# Patient Record
Sex: Female | Born: 1965 | Race: White | Hispanic: No | Marital: Married | Smoking: Current every day smoker
Health system: Southern US, Community
[De-identification: ages and names within clinical notes are randomized; demographics above are authoritative.]

## PROBLEM LIST (undated history)

## (undated) DIAGNOSIS — I728 Aneurysm of other specified arteries: Secondary | ICD-10-CM

## (undated) DIAGNOSIS — E079 Disorder of thyroid, unspecified: Secondary | ICD-10-CM

## (undated) DIAGNOSIS — Z972 Presence of dental prosthetic device (complete) (partial): Secondary | ICD-10-CM

## (undated) HISTORY — PX: THYROIDECTOMY: SHX17

## (undated) HISTORY — PX: CHOLECYSTECTOMY: SHX55

## (undated) HISTORY — PX: NECK SURGERY: SHX720

## (undated) HISTORY — PX: APPENDECTOMY: SHX54

## (undated) HISTORY — PX: ABDOMINAL HYSTERECTOMY: SHX81

## (undated) HISTORY — PX: TUBAL LIGATION: SHX77

## (undated) HISTORY — PX: SPINE SURGERY: SHX786

## (undated) HISTORY — DX: Disorder of thyroid, unspecified: E07.9

---

## 2018-04-15 ENCOUNTER — Emergency Department: Payer: BLUE CROSS/BLUE SHIELD

## 2018-04-15 ENCOUNTER — Other Ambulatory Visit: Payer: Self-pay

## 2018-04-15 DIAGNOSIS — R0789 Other chest pain: Secondary | ICD-10-CM | POA: Diagnosis present

## 2018-04-15 DIAGNOSIS — F17210 Nicotine dependence, cigarettes, uncomplicated: Secondary | ICD-10-CM | POA: Diagnosis not present

## 2018-04-15 DIAGNOSIS — I728 Aneurysm of other specified arteries: Secondary | ICD-10-CM | POA: Insufficient documentation

## 2018-04-15 DIAGNOSIS — R002 Palpitations: Secondary | ICD-10-CM | POA: Diagnosis not present

## 2018-04-15 DIAGNOSIS — R079 Chest pain, unspecified: Secondary | ICD-10-CM | POA: Diagnosis not present

## 2018-04-15 LAB — CBC
HCT: 45.1 % (ref 36.0–46.0)
Hemoglobin: 14.8 g/dL (ref 12.0–15.0)
MCH: 30.6 pg (ref 26.0–34.0)
MCHC: 32.8 g/dL (ref 30.0–36.0)
MCV: 93.2 fL (ref 80.0–100.0)
Platelets: 264 10*3/uL (ref 150–400)
RBC: 4.84 MIL/uL (ref 3.87–5.11)
RDW: 12.5 % (ref 11.5–15.5)
WBC: 14.5 10*3/uL — ABNORMAL HIGH (ref 4.0–10.5)
nRBC: 0 % (ref 0.0–0.2)

## 2018-04-15 LAB — BASIC METABOLIC PANEL
Anion gap: 8 (ref 5–15)
BUN: 10 mg/dL (ref 6–20)
CO2: 24 mmol/L (ref 22–32)
Calcium: 8.8 mg/dL — ABNORMAL LOW (ref 8.9–10.3)
Chloride: 106 mmol/L (ref 98–111)
Creatinine, Ser: 0.69 mg/dL (ref 0.44–1.00)
GFR calc Af Amer: >60 mL/min (ref >60)
GFR calc non Af Amer: >60 mL/min (ref >60)
Glucose, Bld: 101 mg/dL — ABNORMAL HIGH (ref 70–99)
Potassium: 3.7 mmol/L (ref 3.5–5.1)
SODIUM: 138 mmol/L (ref 135–145)

## 2018-04-15 LAB — TROPONIN I
Troponin I: <0.03 ng/mL (ref <0.03)
Troponin I: <0.03 ng/mL (ref <0.03)

## 2018-04-15 NOTE — ED Triage Notes (Signed)
States central CP that goes to back that began this AM. States hx of "heart palpitations but they have gotten more frequent recently"   A&O, ambulatory, no distress noted. Speaking in complete sentences.

## 2018-04-16 ENCOUNTER — Emergency Department: Payer: BLUE CROSS/BLUE SHIELD

## 2018-04-16 ENCOUNTER — Emergency Department
Admission: EM | Admit: 2018-04-16 | Discharge: 2018-04-16 | Disposition: A | Discharge status: Home / Self Care | Payer: BLUE CROSS/BLUE SHIELD | Attending: Emergency Medicine | Admitting: Emergency Medicine

## 2018-04-16 DIAGNOSIS — R079 Chest pain, unspecified: Secondary | ICD-10-CM

## 2018-04-16 DIAGNOSIS — I728 Aneurysm of other specified arteries: Secondary | ICD-10-CM

## 2018-04-16 DIAGNOSIS — R002 Palpitations: Secondary | ICD-10-CM

## 2018-04-16 MED ORDER — AZITHROMYCIN 500 MG PO TABS: 500.0000 mg | ORAL_TABLET | Freq: Every day | ORAL | 0 refills | Status: AC

## 2018-04-16 MED ORDER — IOHEXOL 350 MG/ML SOLN
75.0000 mL | Freq: Once | INTRAVENOUS | Status: AC | PRN
  Administered 2018-04-16: 75 mL via INTRAVENOUS

## 2018-04-16 NOTE — ED Provider Notes (Signed)
[facility] Emergency Department Provider Note _______   First MD Initiated Contact with Patient 04/16/18 [phone]     (approximate)  I have reviewed the triage vital signs and the nursing notes.   HISTORY  Chief Complaint Chest Pain    HPI Crystal Carson is a 52 y.o. female presents to the emergency department with central chest pain radiating to the back which the patient states began this morning.  Patient also admits to intermittent irregular heartbeat which patient states has been happening for "a very long time however has become more frequent lately.  Patient denies any lower extremity pain or swelling.  Patient denies any dyspnea.  Patient denies any history of CAD PE or DVT.  Patient does admit to 1 pack/day cigarette habit.   Past medical history No chronic medical conditions. There are no active problems to display for this patient.   Past Surgical History:  Procedure Laterality Date  . ABDOMINAL HYSTERECTOMY    . APPENDECTOMY    . CESAREAN SECTION    . CHOLECYSTECTOMY    . NECK SURGERY    . THYROIDECTOMY      Prior to Admission medications   Medication Sig Start Date End Date Taking? Authorizing Provider  azithromycin (ZITHROMAX) 500 MG tablet Take 1 tablet (500 mg total) by mouth daily for 3 days. Take 1 tablet daily for 3 days. 04/16/18 04/19/18  Gregor Hams, MD    Allergies No known drug allergies History reviewed. No pertinent family history.  Social History Social History   Tobacco Use  . Smoking status: Current Every Day Smoker  Substance Use Topics  . Alcohol use: Yes  . Drug use: Not on file    Review of Systems Constitutional: No fever/chills Eyes: No visual changes. ENT: No sore throat. Cardiovascular: Positive for chest pain. Respiratory: Denies shortness of breath. Gastrointestinal: No abdominal pain.  No nausea, no vomiting.  No diarrhea.  No constipation. Genitourinary: Negative for  dysuria. Musculoskeletal: Negative for neck pain.  Negative for back pain. Integumentary: Negative for rash. Neurological: Negative for headaches, focal weakness or numbness.   ____________________________________________   PHYSICAL EXAM:  VITAL SIGNS: ED Triage Vitals  Enc Vitals Group     BP 04/15/18 1830 132/89     Pulse Rate 04/15/18 1830 96     Resp 04/15/18 1830 18     Temp 04/15/18 1830 98.5 F (36.9 C)     Temp Source 04/15/18 2141 Oral     SpO2 04/15/18 1830 99 %     Weight 04/15/18 1755 86.2 kg (190 lb)     Height 04/15/18 1755 1.651 m (5\' 5" )     Head Circumference --      Peak Flow --      Pain Score 04/15/18 1755 4     Pain Loc --      Pain Edu? --      Excl. in [place]? --     Constitutional: Alert and oriented. Well appearing and in no acute distress. Eyes: Conjunctivae are normal.  Mouth/Throat: Mucous membranes are moist.  Oropharynx non-erythematous. Neck: No stridor.  Cardiovascular: Normal rate, regular rhythm. Good peripheral circulation. Grossly normal heart sounds. Respiratory: Normal respiratory effort.  No retractions. Lungs CTAB. Gastrointestinal: Soft and nontender. No distention.  Musculoskeletal: No lower extremity tenderness nor edema. No gross deformities of extremities. Neurologic:  Normal speech and language. No gross focal neurologic deficits are appreciated.  Skin:  Skin is warm, dry and intact. No rash noted.  Psychiatric: Mood and affect are normal. Speech and behavior are normal.  ____________________________________________   LABS (all labs ordered are listed, but only abnormal results are displayed)  Labs Reviewed  BASIC METABOLIC PANEL - Abnormal; Notable for the following components:      Result Value   Glucose, Bld 101 (*)    Calcium 8.8 (*)    All other components within normal limits  CBC - Abnormal; Notable for the following components:   WBC 14.5 (*)    All other components within normal limits  TROPONIN I  TROPONIN I    ____________________________________________  EKG  ED ECG REPORT I, Beckwourth N , the attending physician, personally viewed and interpreted this ECG.   Date: 04/16/2018  EKG Time: 5:57 PM  Rate: 74  Rhythm: Normal sinus rhythm  Axis: Normal  Intervals: Normal  ST&T Change: None  ____________________________________________  RADIOLOGY I, Robeline N , personally viewed and evaluated these images (plain radiographs) as part of my medical decision making, as well as reviewing the written report by the radiologist.  ED MD interpretation: No active cardiopulmonary disease noted on chest x-ray. CT angiogram of the chest revealed no evidence of pulmonary emboli per radiologist.  Incidental finding of a 3.1 cm splenic artery aneurysm noted  Official radiology report(s): Dg Chest 2 View  Result Date: 04/15/2018 CLINICAL DATA:  Chest pain. EXAM: CHEST - 2 VIEW COMPARISON:  None. FINDINGS: The heart size and mediastinal contours are within normal limits. Both lungs are clear. No pneumothorax or pleural effusion is noted. The visualized skeletal structures are unremarkable. IMPRESSION: No active cardiopulmonary disease. Electronically Signed   By: Marijo Conception, M.D.   On: 04/15/2018 18:25   Ct Angio Chest Pe W And/or Wo Contrast  Result Date: 04/16/2018 CLINICAL DATA:  52 year old female with chest pain. EXAM: CT ANGIOGRAPHY CHEST WITH CONTRAST TECHNIQUE: Multidetector CT imaging of the chest was performed using the standard protocol during bolus administration of intravenous contrast. Multiplanar CT image reconstructions and MIPs were obtained to evaluate the vascular anatomy. CONTRAST:  23mL OMNIPAQUE IOHEXOL 350 MG/ML SOLN COMPARISON:  Chest radiograph dated 04/15/2018 FINDINGS: Cardiovascular: There is no cardiomegaly or pericardial effusion. The thoracic aorta is unremarkable. The origins of the great vessels of the aortic arch appear patent as visualized. There is no CT  evidence of pulmonary embolism. Mediastinum/Nodes: Top-normal bilateral hilar lymph nodes measuring up to 13 mm in the right hilum of indeterminate clinical significance, possibly reactive. Clinical correlation is recommended. Esophagus is grossly unremarkable. Status post prior right hemithyroidectomy. No mediastinal fluid collection. Lungs/Pleura: Several small scattered calcified granuloma. No focal consolidation, pleural effusion, or pneumothorax. The central airways are patent. Upper Abdomen: Cholecystectomy. 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 Musculoskeletal: Mild degenerative changes of the spine. Partially visualized lower cervical ACDF. Review of the MIP images confirms the above findings. IMPRESSION: 1. No acute intrathoracic pathology. No CT evidence of pulmonary embolism. 2. **An incidental finding of potential clinical significance has been found. A 3.1 Cm rim calcified splenic artery aneurysm. Interventional radiology or vascular surgery consult is advised.** Electronically Signed   By: Anner Crete M.D.   On: 04/16/2018 02:16    Procedures   ____________________________________________   INITIAL IMPRESSION / ASSESSMENT AND PLAN / ED COURSE  As part of my medical decision making, I reviewed the following data within the electronic MEDICAL RECORD NUMBER   52 year old female presenting with above-stated history and physical exam secondary to chest pain.  Patient has no  chest pain at present.  Considered possibly of CAD and a such EKG was performed which revealed no evidence of ischemia or infarction troponin negative x2.  Also considered possibility of a pulmonary emboli or aortic aneurysm and as such CT angiogram of the chest was performed which revealed no evidence of PE or aneurysm.  Incidental finding of a splenic aneurysm was noted.  Patient discussed with Dr. dew vascular surgeon regarding the patient's splenic aneurysm who will see the patient on the outpatient  setting.  Patient will be referred to cardiology Dr. Rockey Situ as well for further outpatient evaluation.  Spoke with the patient and her husband at length regarding necessity of outpatient follow-up and also instructed them of warning signs that would warrant immediate return to the emergency department. ____________________________________________  FINAL CLINICAL IMPRESSION(S) / ED DIAGNOSES  Final diagnoses:  Splenic artery aneurysm (HCC)  Nonspecific chest pain  Heart palpitations     MEDICATIONS GIVEN DURING THIS VISIT:  Medications  iohexol (OMNIPAQUE) 350 MG/ML injection 75 mL (75 mLs Intravenous Contrast Given 04/16/18 0147)     ED Discharge Orders         Ordered    azithromycin (ZITHROMAX) 500 MG tablet  Daily     04/16/18 0332           Note:  This document was prepared using Dragon voice recognition software and may include unintentional dictation errors.    Gregor Hams, MD 04/16/18 0730

## 2018-04-19 ENCOUNTER — Telehealth (INDEPENDENT_AMBULATORY_CARE_PROVIDER_SITE_OTHER): Payer: Self-pay

## 2018-04-19 NOTE — Telephone Encounter (Signed)
I attempted to contact the patient by leaving a voicemail, unfortunately her voicemail hangs up before you can leave your name and number for a return call. I attempted to contact her spouse and had the same result of the voicemail hanging up before I could leave a message.

## 2018-04-20 ENCOUNTER — Ambulatory Visit (INDEPENDENT_AMBULATORY_CARE_PROVIDER_SITE_OTHER): Payer: BLUE CROSS/BLUE SHIELD | Admitting: Vascular Surgery

## 2018-04-20 ENCOUNTER — Encounter (INDEPENDENT_AMBULATORY_CARE_PROVIDER_SITE_OTHER): Payer: Self-pay | Admitting: Vascular Surgery

## 2018-04-20 VITALS — BP 123/81 | HR 88 | Resp 18 | Ht 65.0 in | Wt 196.4 lb

## 2018-04-20 DIAGNOSIS — F17200 Nicotine dependence, unspecified, uncomplicated: Secondary | ICD-10-CM

## 2018-04-20 DIAGNOSIS — I728 Aneurysm of other specified arteries: Secondary | ICD-10-CM

## 2018-04-20 DIAGNOSIS — Z720 Tobacco use: Secondary | ICD-10-CM | POA: Insufficient documentation

## 2018-04-20 NOTE — Assessment & Plan Note (Signed)
Tobacco use was a risk factor for splenic artery aneurysm or other aneurysm formation.  Tobacco cessation is recommended.

## 2018-04-20 NOTE — Assessment & Plan Note (Signed)
I have independently reviewed the patient's CT scan and she has an approximately 3 cm splenic artery aneurysm near the hilum of the spleen.  We had a long discussion today.  This is a reasonably large aneurysm and does pose a risk of rupture.  This is a serious situation and should be repaired.  We discussed several options for repair including open splenectomy, covered stent placement, and coil embolization.  The location of her aneurysmal likely preclude covered stent placement, but coil embolization would be an excellent option and much less invasive than surgery.  I have discussed the risks and benefits of the procedure in detail.  The patient and her daughter voiced their understanding and are agreeable to proceed but would like to do this after the first of the year.

## 2018-04-20 NOTE — Progress Notes (Signed)
Patient ID: Crystal Carson, female   DOB: [birth year], 52 y.o.   MRN: [id number]  Chief Complaint  Patient presents with  . New Patient (Initial Visit)    HPI Crystal Carson is a 53 y.o. female.  I am asked to see the patient by Dr. Owens Carson for evaluation of splenic artery aneurysm.  The patient reports no obvious symptoms related to the aneurysm.  She was seen in the ER last week with chest pain and as part of her work-up a CT scan of the chest was performed which I have independently reviewed.  Her chest pain has resolved.  On the lower cuts of the CT scan of the chest, she has a 3 cm splenic artery aneurysm.  This is near the hilum of the spleen and well out a tortuous splenic artery.  There is flow in the aneurysm sac.  There is no evidence of extravasation or rupture.  No other obvious aneurysms are seen, but this did not include the majority of the abdomen or the pelvis on the CT scan.  She has a long history of tobacco use which continues.   Past Medical History Chest pain Tobacco use Splenic artery aneurysm  Past Surgical History:  Procedure Laterality Date  . ABDOMINAL HYSTERECTOMY    . APPENDECTOMY    . CESAREAN SECTION    . CHOLECYSTECTOMY    . NECK SURGERY    . THYROIDECTOMY      Family History No bleeding disorders, clotting disorders, aneurysms, or autoimmune diseases  Social History Social History   Tobacco Use  . Smoking status: Current Every Day Smoker  . Smokeless tobacco: Never Used  Substance Use Topics  . Alcohol use: Yes  . Drug use: Not on file    No Known Allergies  No current outpatient medications on file.   No current facility-administered medications for this visit.       REVIEW OF SYSTEMS (Negative unless checked)  Constitutional: [] Weight loss  [] Fever  [] Chills Cardiac: [x] Chest pain   [] Chest pressure   [x] Palpitations   [] Shortness of breath when laying flat   [] Shortness of breath at rest   [x] Shortness of breath with  exertion. Vascular:  [] Pain in legs with walking   [] Pain in legs at rest   [] Pain in legs when laying flat   [] Claudication   [] Pain in feet when walking  [] Pain in feet at rest  [] Pain in feet when laying flat   [] History of DVT   [] Phlebitis   [] Swelling in legs   [] Varicose veins   [] Non-healing ulcers Pulmonary:   [] Uses home oxygen   [] Productive cough   [] Hemoptysis   [] Wheeze  [] COPD   [] Asthma Neurologic:  [] Dizziness  [] Blackouts   [] Seizures   [] History of stroke   [] History of TIA  [] Aphasia   [] Temporary blindness   [] Dysphagia   [] Weakness or numbness in arms   [] Weakness or numbness in legs Musculoskeletal:  [] Arthritis   [] Joint swelling   [] Joint pain   [] Low back pain Hematologic:  [] Easy bruising  [] Easy bleeding   [] Hypercoagulable state   [] Anemic  [] Hepatitis Gastrointestinal:  [] Blood in stool   [] Vomiting blood  [] Gastroesophageal reflux/heartburn   [] Abdominal pain Genitourinary:  [] Chronic kidney disease   [] Difficult urination  [] Frequent urination  [] Burning with urination   [] Hematuria Skin:  [] Rashes   [] Ulcers   [] Wounds Psychological:  [] History of anxiety   []  History of major depression.    Physical Exam BP 123/81 (BP Location:  Right Arm, Patient Position: Sitting)   Pulse 88   Resp 18   Ht 5\' 5"  (1.651 m)   Wt 196 lb 6.4 oz (89.1 kg)   BMI 32.68 kg/m  Gen:  WD/WN, NAD Head: [place]/AT, No temporalis wasting.  Ear/Nose/Throat: Hearing grossly intact, nares w/o erythema or drainage, oropharynx w/o Erythema/Exudate Eyes: Conjunctiva clear, sclera non-icteric  Neck: trachea midline.   Pulmonary:  Good air movement, aspirations not labored, no use of accessory muscles Cardiac: RRR, no JVD Vascular:  Vessel Right Left  Radial Palpable Palpable                          PT Palpable Palpable  DP Palpable Palpable   Gastrointestinal: soft, non-tender/non-distended. No guarding/reflex. Musculoskeletal: M/S 5/5 throughout.  Extremities without ischemic  changes.  No deformity or atrophy.  No edema. Neurologic: Sensation grossly intact in extremities.  Symmetrical.  Speech is fluent. Motor exam as listed above. Psychiatric: Judgment intact, Mood & affect appropriate for pt's clinical situation. Dermatologic: No rashes or ulcers noted.  No cellulitis or open wounds.   Radiology Dg Chest 2 View  Result Date: 04/15/2018 CLINICAL DATA:  Chest pain. EXAM: CHEST - 2 VIEW COMPARISON:  None. FINDINGS: The heart size and mediastinal contours are within normal limits. Both lungs are clear. No pneumothorax or pleural effusion is noted. The visualized skeletal structures are unremarkable. IMPRESSION: No active cardiopulmonary disease. Electronically Signed   By: Marijo Conception, M.D.   On: 04/15/2018 18:25   Ct Angio Chest Pe W And/or Wo Contrast  Result Date: 04/16/2018 CLINICAL DATA:  52 year old female with chest pain. EXAM: CT ANGIOGRAPHY CHEST WITH CONTRAST TECHNIQUE: Multidetector CT imaging of the chest was performed using the standard protocol during bolus administration of intravenous contrast. Multiplanar CT image reconstructions and MIPs were obtained to evaluate the vascular anatomy. CONTRAST:  41mL OMNIPAQUE IOHEXOL 350 MG/ML SOLN COMPARISON:  Chest radiograph dated 04/15/2018 FINDINGS: Cardiovascular: There is no cardiomegaly or pericardial effusion. The thoracic aorta is unremarkable. The origins of the great vessels of the aortic arch appear patent as visualized. There is no CT evidence of pulmonary embolism. Mediastinum/Nodes: Top-normal bilateral hilar lymph nodes measuring up to 13 mm in the right hilum of indeterminate clinical significance, possibly reactive. Clinical correlation is recommended. Esophagus is grossly unremarkable. Status post prior right hemithyroidectomy. No mediastinal fluid collection. Lungs/Pleura: Several small scattered calcified granuloma. No focal consolidation, pleural effusion, or pneumothorax. The central airways  are patent. Upper Abdomen: Cholecystectomy. 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 Musculoskeletal: Mild degenerative changes of the spine. Partially visualized lower cervical ACDF. Review of the MIP images confirms the above findings. IMPRESSION: 1. No acute intrathoracic pathology. No CT evidence of pulmonary embolism. 2. **An incidental finding of potential clinical significance has been found. A 3.1 Cm rim calcified splenic artery aneurysm. Interventional radiology or vascular surgery consult is advised.** Electronically Signed   By: Anner Crete M.D.   On: 04/16/2018 02:16    Labs Recent Results (from the past 2160 hour(s))  Basic metabolic panel     Status: Abnormal   Collection Time: 04/15/18  5:58 PM  Result Value Ref Range   Sodium 138 135 - 145 mmol/L   Potassium 3.7 3.5 - 5.1 mmol/L   Chloride 106 98 - 111 mmol/L   CO2 24 22 - 32 mmol/L   Glucose, Bld 101 (H) 70 - 99 mg/dL   BUN 10 6 - 20  mg/dL   Creatinine, Ser 0.69 0.44 - 1.00 mg/dL   Calcium 8.8 (L) 8.9 - 10.3 mg/dL   GFR calc non Af Amer >60 >60 mL/min   GFR calc Af Amer >60 >60 mL/min   Anion gap 8 5 - 15    Comment: Performed at [facility], [address] [place], [place] [address]  CBC     Status: Abnormal   Collection Time: 04/15/18  5:58 PM  Result Value Ref Range   WBC 14.5 (H) 4.0 - 10.5 K/uL   RBC 4.84 3.87 - 5.11 MIL/uL   Hemoglobin 14.8 12.0 - 15.0 g/dL   HCT 45.1 36.0 - 46.0 %   MCV 93.2 80.0 - 100.0 fL   MCH 30.6 26.0 - 34.0 pg   MCHC 32.8 30.0 - 36.0 g/dL   RDW 12.5 11.5 - 15.5 %   Platelets 264 150 - 400 K/uL   nRBC 0.0 0.0 - 0.2 %    Comment: Performed at [facility], [address] [place], [place] [address]  Troponin I - ONCE - STAT     Status: None   Collection Time: 04/15/18  5:58 PM  Result Value Ref Range   Troponin I <0.03 <0.03 ng/mL    Comment: Performed at [facility], [address] [place], [place] [address]  Troponin I - ONCE -  STAT     Status: None   Collection Time: 04/15/18  9:55 PM  Result Value Ref Range   Troponin I <0.03 <0.03 ng/mL    Comment: Performed at [facility], [address] [place],  [address]    Assessment/Plan:  Tobacco use disorder Tobacco use was a risk factor for splenic artery aneurysm or other aneurysm formation.  Tobacco cessation is recommended.  Splenic artery aneurysm ([place]) I have independently reviewed the patient's CT scan and she has an approximately 3 cm splenic artery aneurysm near the hilum of the spleen.  We had a long discussion today.  This is a reasonably large aneurysm and does pose a risk of rupture.  This is a serious situation and should be repaired.  We discussed several options for repair including open splenectomy, covered stent placement, and coil embolization.  The location of her aneurysmal likely preclude covered stent placement, but coil embolization would be an excellent option and much less invasive than surgery.  I have discussed the risks and benefits of the procedure in detail.  The patient and her daughter voiced their understanding and are agreeable to proceed but would like to do this after the first of the year.      Leotis Pain 04/20/2018, 12:02 PM   This note was created with Dragon medical transcription system.  Any errors from dictation are unintentional.

## 2018-04-20 NOTE — Patient Instructions (Signed)
Retroperitoneal Bleeding  Retroperitoneal bleeding happens when the blood vessels behind your abdomen bleed into the space between your abdomen and your back (retroperitoneal space). This space contains:   Your kidneys.   The glands that are on top of your kidneys (adrenal glands).   The tubes that drain urine from your kidneys (ureters).   The large blood vessel that carries blood to your lower body (aorta).   Some parts of your digestive tract.    Bleeding may come from any organ in the retroperitoneal space. This is a rare but life-threatening condition.  What are the causes?  This condition may be caused by:   Surgery in the retroperitoneal space.   Injury (trauma) to your pelvic area, abdomen, or back.    When retroperitoneal bleeding is not caused by trauma, it is called spontaneous retroperitoneal bleeding. Spontaneous retroperitoneal bleeding may be caused by:   Tumors of the kidneys or adrenal glands.   Use of medicines that thin your blood (anticoagulants).   Diseases that cause your body to be unable to form blood clots (clotting disorders).   Swelling in a blood vessel from a weakened artery wall (aneurysm) that can rupture and cause bleeding.    Sometimes, the cause is not known (idiopathic).  What increases the risk?  This condition is more likely to develop in people:   Who are on dialysis.   Who take anticoagulant medicine.   Who have high blood pressure.   Who have hardening of the arteries (atherosclerosis).    What are the signs or symptoms?  Signs and symptoms may appear suddenly if the bleeding starts suddenly (acute), or they may appear gradually if the bleeding occurs slowly over time (chronic). Symptoms of this condition include:   Pain in the abdomen or side (flank).   Pain when pressing on the abdomen or flank.   Dizziness or light-headedness from low blood pressure.   Rapid heartbeat (tachycardia).   Blood in the urine.   Very low blood pressure, which can cause  shock. Symptoms of shock include:  ? Fainting.  ? Trouble with breathing.  ? Cold, clammy skin.    How is this diagnosed?  This condition may be diagnosed based on your symptoms and your medical history. It is important to find the cause of the bleeding. Your health care provider will do a physical exam. You may also have tests, including:   Abdominal X-rays to check for signs of bleeding or aneurysm.   Blood tests to check for low blood counts (anemia) or blood loss.   Imaging studies, such as:  ? CT scan. You may have dye injected into a blood vessel to help locate the source of the bleeding (CT angiography).  ? Ultrasound.  ? MRI.    How is this treated?  The goal of treatment is to remove the blood and stop the bleeding. Treatment may include:   Giving you fluids through an IV tube to get your blood pressure back into a normal range.   Giving you blood from a donor (transfusion).   Placing a long tube (catheter) through your blood vessel to the site of bleeding and blocking the bleeding with a small plug (embolization).   Exploratory surgery to find the bleeding and stop it. This may be done using an operating scope (laparoscopy) or as an open surgery (laparotomy).    This information is not intended to replace advice given to you by your health care provider. Make sure you discuss any 

## 2018-05-03 ENCOUNTER — Encounter (INDEPENDENT_AMBULATORY_CARE_PROVIDER_SITE_OTHER): Payer: Self-pay

## 2018-05-08 NOTE — Progress Notes (Signed)
Cardiology Office Note  Date:  05/10/2018   ID:  Crystal Carson, DOB [birth year], MRN [id number]  PCP:  Patient, No Pcp Per   Chief Complaint  Patient presents with  . other    Follow up from [facility] ER; chest pain. Pt. c/o palpitations, chest pain and shortness of breath.    HPI:  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 Who presents by referral from the emergency room for evaluation of her chest pain symptoms  Reports that she went to the emergency room April 16, 2018 Hospital records reviewed with the patient in detail Long hx of palpitations On December 13 she felt a "heaviness in the chest" worse that day Symptoms went all day, little better in the Er Seem to resolve on their own Denies having any pressure in her chest in the past No pressure since that time No regular exercise program but able to do all of her ADLs and shopping without any chest discomfort -Emergency room work-up reviewed with her, I EKG benign, cardiac enzymes negative  She does not have any inhalers Denies having any significant GERD  Chest CT  04/2018 images old up and reviewed with her 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 dew CT scan with no coronary calcification, no significant aortic atherosclerosis  She is having some left side abdominal discomfort Etiology unclear, she wonders if it could be from the splenic aneurysm  EKG personally reviewed by myself on todays visit Shows normal sinus rhythm with rate 78 bpm no significant ST or T wave changes   PMH:   has no past medical history on file.  PSH:    Past Surgical History:  Procedure Laterality Date  . ABDOMINAL HYSTERECTOMY    . APPENDECTOMY    . CESAREAN SECTION    . CHOLECYSTECTOMY    . NECK SURGERY    . THYROIDECTOMY      No current  outpatient medications on file.   No current facility-administered medications for this visit.     Allergies:   Patient has no known allergies.   Social History:  The patient  reports that she has been smoking cigarettes. She has a 40.00 pack-year smoking history. She has never used smokeless tobacco. She reports current alcohol use. She reports that she does not use drugs.   Family History:   family history includes Heart attack in her paternal grandfather; Hypertension in her mother; Stroke (age of onset: 67) in her mother.    Review of Systems: Review of Systems  Constitutional: Negative.   Respiratory: Negative.   Cardiovascular: Positive for chest pain.  Gastrointestinal: Positive for abdominal pain.  Musculoskeletal: Negative.   Neurological: Negative.   Psychiatric/Behavioral: Negative.   All other systems reviewed and are negative.    PHYSICAL EXAM: VS:  BP 120/80 (BP Location: Right Arm, Patient Position: Sitting, Cuff Size: Normal)   Pulse 78   Ht 5\' 5"  (1.651 m)   Wt 198 lb 4 oz (89.9 kg)   BMI 32.99 kg/m  , BMI Body mass index is 32.99 kg/m. GEN: Well nourished, well developed, in no acute distress  HEENT: normal  Neck: no JVD, carotid bruits, or masses Cardiac: RRR; no murmurs, rubs, or gallops,no edema  Respiratory:  clear to auscultation bilaterally, normal work of breathing GI: soft, nontender, nondistended, + BS  MS: no deformity or atrophy  Skin: warm and dry, no rash Neuro:  Strength and sensation are intact Psych: euthymic mood, full affect    Recent Labs: 04/15/2018: BUN 10; Creatinine, Ser 0.69; Hemoglobin 14.8; Platelets 264; Potassium 3.7; Sodium 138    Lipid Panel No results found for: CHOL, HDL, LDLCALC, TRIG    Wt Readings from Last 3 Encounters:  05/10/18 198 lb 4 oz (89.9 kg)  04/20/18 196 lb 6.4 oz (89.1 kg)  04/15/18 190 lb (86.2 kg)       ASSESSMENT AND PLAN:  Splenic artery aneurysm (HCC) - Plan: EKG 12-Lead Scheduled  for coiling later this week CT scan images reviewed with her in detail  Tobacco use disorder Coupon provided for Chantix She will think about it, and if she would like to try it she will talk with primary care for a prescription  Chest pain, unspecified type - Plan: EKG 12-Lead Atypical in nature, no further episodes since the emergency room CT scan reviewed with her with no coronary calcification and no aortic atherosclerosis Relatively good exercise tolerance Recommend if she has any further episodes especially with exertion that she call our office for further evaluation, stress test could be performed  Disposition:   F/U as needed   Total encounter time more than 45 minutes  Greater than 50% was spent in counseling and coordination of care with the patient    Orders Placed This Encounter  Procedures  . EKG 12-Lead     Signed, Esmond Plants, M.D., Ph.D. 05/10/2018  [place], [place]

## 2018-05-10 ENCOUNTER — Ambulatory Visit (INDEPENDENT_AMBULATORY_CARE_PROVIDER_SITE_OTHER): Payer: BLUE CROSS/BLUE SHIELD | Admitting: Cardiovascular Disease

## 2018-05-10 ENCOUNTER — Encounter: Payer: Self-pay | Admitting: Cardiovascular Disease

## 2018-05-10 VITALS — BP 120/80 | HR 78 | Ht 65.0 in | Wt 198.2 lb

## 2018-05-10 DIAGNOSIS — F17200 Nicotine dependence, unspecified, uncomplicated: Secondary | ICD-10-CM | POA: Diagnosis not present

## 2018-05-10 DIAGNOSIS — I728 Aneurysm of other specified arteries: Secondary | ICD-10-CM | POA: Diagnosis not present

## 2018-05-10 DIAGNOSIS — R079 Chest pain, unspecified: Secondary | ICD-10-CM | POA: Diagnosis not present

## 2018-05-10 NOTE — Patient Instructions (Signed)

## 2018-05-12 ENCOUNTER — Other Ambulatory Visit (INDEPENDENT_AMBULATORY_CARE_PROVIDER_SITE_OTHER): Payer: Self-pay | Admitting: Nurse Practitioner

## 2018-05-12 MED ORDER — DEXTROSE 5 % IV SOLN
2.0000 g | Freq: Once | INTRAVENOUS | Status: DC
  Filled 2018-05-12: qty 20

## 2018-05-13 ENCOUNTER — Encounter
Admission: RE | Disposition: A | Discharge status: Home / Self Care | Payer: Self-pay | Source: Home / Self Care | Attending: Vascular Surgery

## 2018-05-13 ENCOUNTER — Other Ambulatory Visit: Payer: Self-pay

## 2018-05-13 ENCOUNTER — Ambulatory Visit
Admission: RE | Admit: 2018-05-13 | Discharge: 2018-05-13 | Disposition: A | Discharge status: Home / Self Care | Payer: BLUE CROSS/BLUE SHIELD | Attending: Vascular Surgery | Admitting: Vascular Surgery

## 2018-05-13 DIAGNOSIS — I728 Aneurysm of other specified arteries: Secondary | ICD-10-CM

## 2018-05-13 DIAGNOSIS — Z90710 Acquired absence of both cervix and uterus: Secondary | ICD-10-CM | POA: Diagnosis not present

## 2018-05-13 DIAGNOSIS — F17200 Nicotine dependence, unspecified, uncomplicated: Secondary | ICD-10-CM | POA: Diagnosis not present

## 2018-05-13 HISTORY — PX: VISCERAL ANGIOGRAPHY: CATH118276

## 2018-05-13 LAB — CREATININE, SERUM
Creatinine, Ser: 0.57 mg/dL (ref 0.44–1.00)
GFR calc Af Amer: >60 mL/min (ref >60)
GFR calc non Af Amer: >60 mL/min (ref >60)

## 2018-05-13 LAB — BUN: BUN: 13 mg/dL (ref 6–20)

## 2018-05-13 SURGERY — VISCERAL ANGIOGRAPHY
Anesthesia: Moderate Sedation

## 2018-05-13 MED ORDER — FENTANYL CITRATE (PF) 100 MCG/2ML IJ SOLN
INTRAMUSCULAR | Status: AC
  Filled 2018-05-13: qty 2

## 2018-05-13 MED ORDER — MIDAZOLAM HCL 2 MG/2ML IJ SOLN
INTRAMUSCULAR | Status: AC
  Filled 2018-05-13: qty 2

## 2018-05-13 MED ORDER — FENTANYL CITRATE (PF) 100 MCG/2ML IJ SOLN
INTRAMUSCULAR | Status: DC | PRN
  Administered 2018-05-13 (×3): 50 ug via INTRAVENOUS
  Administered 2018-05-13 (×2): 25 ug via INTRAVENOUS

## 2018-05-13 MED ORDER — CEFAZOLIN SODIUM-DEXTROSE 2-4 GM/100ML-% IV SOLN
INTRAVENOUS | Status: AC
  Administered 2018-05-13: 09:00:00
  Filled 2018-05-13: qty 100

## 2018-05-13 MED ORDER — HYDROCODONE-ACETAMINOPHEN 5-325 MG PO TABS
ORAL_TABLET | ORAL | Status: AC
  Filled 2018-05-13: qty 1

## 2018-05-13 MED ORDER — SODIUM CHLORIDE 0.9 % IV SOLN
INTRAVENOUS | Status: DC
  Administered 2018-05-13: 08:00:00 via INTRAVENOUS

## 2018-05-13 MED ORDER — IOPAMIDOL (ISOVUE-300) INJECTION 61%
INTRAVENOUS | Status: DC | PRN
  Administered 2018-05-13: 50 mL via INTRA_ARTERIAL

## 2018-05-13 MED ORDER — HYDROMORPHONE HCL 1 MG/ML IJ SOLN: 1.0000 mg | Freq: Once | INTRAMUSCULAR | Status: DC | PRN

## 2018-05-13 MED ORDER — HYDROCODONE-ACETAMINOPHEN 5-325 MG PO TABS: 1.0000 | ORAL_TABLET | Freq: Four times a day (QID) | ORAL | 0 refills | Status: DC | PRN

## 2018-05-13 MED ORDER — HEPARIN SODIUM (PORCINE) 1000 UNIT/ML IJ SOLN
INTRAMUSCULAR | Status: AC
  Filled 2018-05-13: qty 1

## 2018-05-13 MED ORDER — HEPARIN SODIUM (PORCINE) 1000 UNIT/ML IJ SOLN
INTRAMUSCULAR | Status: DC | PRN
  Administered 2018-05-13: 3000 [IU] via INTRAVENOUS

## 2018-05-13 MED ORDER — MIDAZOLAM HCL 5 MG/5ML IJ SOLN
INTRAMUSCULAR | Status: AC
  Filled 2018-05-13: qty 5

## 2018-05-13 MED ORDER — ONDANSETRON HCL 4 MG/2ML IJ SOLN: 4.0000 mg | Freq: Four times a day (QID) | INTRAMUSCULAR | Status: DC | PRN

## 2018-05-13 MED ORDER — LIDOCAINE-EPINEPHRINE (PF) 1 %-1:200000 IJ SOLN
INTRAMUSCULAR | Status: AC
  Filled 2018-05-13: qty 10

## 2018-05-13 MED ORDER — MIDAZOLAM HCL 2 MG/2ML IJ SOLN
INTRAMUSCULAR | Status: DC | PRN
  Administered 2018-05-13: 1 mg via INTRAVENOUS
  Administered 2018-05-13: 2 mg via INTRAVENOUS
  Administered 2018-05-13 (×3): 1 mg via INTRAVENOUS

## 2018-05-13 MED ORDER — HYDROCODONE-ACETAMINOPHEN 5-325 MG PO TABS
1.0000 | ORAL_TABLET | Freq: Once | ORAL | Status: AC
  Administered 2018-05-13: 1 via ORAL

## 2018-05-13 MED ORDER — HEPARIN (PORCINE) IN NACL 1000-0.9 UT/500ML-% IV SOLN
INTRAVENOUS | Status: AC
  Filled 2018-05-13: qty 1000

## 2018-05-13 SURGICAL SUPPLY — 29 items
CATH BEACON 5 .035 65 KMP TIP (CATHETERS) ×1 IMPLANT
CATH MICROCATH PRGRT 2.8F 110 (CATHETERS) IMPLANT
CATH PIG 70CM (CATHETERS) ×1 IMPLANT
CATH VS15FR (CATHETERS) ×1 IMPLANT
COIL 400 COMPLEX SOFT 10X35CM (Vascular Products) ×1 IMPLANT
COIL 400 COMPLEX SOFT 20X60CM (Vascular Products) ×1 IMPLANT
COIL 400 COMPLEX STD 10X35CM (Vascular Products) ×2 IMPLANT
COIL 400 COMPLEX STD 24X57CM (Vascular Products) ×1 IMPLANT
COIL 400 COMPLEX STD 8X25CM (Vascular Products) ×1 IMPLANT
COIL 400 COMPLEX STD 9X30CM (Vascular Products) ×1 IMPLANT
DEVICE OCCLUSION POD8 (Vascular Products) IMPLANT
DEVICE OCCLUSION PODJ45 (Vascular Products) IMPLANT
DEVICE OCCLUSION PODJ60 (Vascular Products) IMPLANT
DEVICE STARCLOSE SE CLOSURE (Vascular Products) ×1 IMPLANT
DEVICE TORQUE .025-.038 (MISCELLANEOUS) ×1 IMPLANT
GLIDECATH 4FR STR (CATHETERS) ×1 IMPLANT
GLIDEWIRE STIFF .35X180X3 HYDR (WIRE) ×1 IMPLANT
HANDLE DETACHMENT COIL (MISCELLANEOUS) ×1 IMPLANT
MICROCATH PROGREAT 2.8F 110 CM (CATHETERS) ×2
OCCLUSION DEVICE POD8 (Vascular Products) ×2 IMPLANT
OCCLUSION DEVICE PODJ45 (Vascular Products) ×2 IMPLANT
OCCLUSION DEVICE PODJ60 (Vascular Products) ×2 IMPLANT
PACK ANGIOGRAPHY (CUSTOM PROCEDURE TRAY) ×2 IMPLANT
SHEATH ANL2 6FRX45 HC (SHEATH) ×1 IMPLANT
SHEATH BRITE TIP 5FRX11 (SHEATH) ×1 IMPLANT
SYR MEDRAD MARK V 150ML (SYRINGE) ×1 IMPLANT
TUBING CONTRAST HIGH PRESS 72 (TUBING) ×2 IMPLANT
WIRE J 3MM .035X145CM (WIRE) ×1 IMPLANT
WIRE MAGIC TOR.035 180C (WIRE) ×1 IMPLANT

## 2018-05-13 NOTE — Discharge Instructions (Signed)
Moderate Conscious Sedation, Adult, Care After °These instructions provide you with information about caring for yourself after your procedure. Your health care provider may also give you more specific instructions. Your treatment has been planned according to current medical practices, but problems sometimes occur. Call your health care provider if you have any problems or questions after your procedure. °What can I expect after the procedure? °After your procedure, it is common: °· To feel sleepy for several hours. °· To feel clumsy and have poor balance for several hours. °· To have poor judgment for several hours. °· To vomit if you eat too soon. °Follow these instructions at home: °For at least 24 hours after the procedure: ° °· Do not: °? Participate in activities where you could fall or become injured. °? Drive. °? Use heavy machinery. °? Drink alcohol. °? Take sleeping pills or medicines that cause drowsiness. °? Make important decisions or sign legal documents. °? Take care of children on your own. °· Rest. °Eating and drinking °· Follow the diet recommended by your health care provider. °· If you vomit: °? Drink water, juice, or soup when you can drink without vomiting. °? Make sure you have little or no nausea before eating solid foods. °General instructions °· Have a responsible adult stay with you until you are awake and alert. °· Take over-the-counter and prescription medicines only as told by your health care provider. °· If you smoke, do not smoke without supervision. °· Keep all follow-up visits as told by your health care provider. This is important. °Contact a health care provider if: °· You keep feeling nauseous or you keep vomiting. °· You feel light-headed. °· You develop a rash. °· You have a fever. °Get help right away if: °· You have trouble breathing. °This information is not intended to replace advice given to you by your health care provider. Make sure you discuss any questions you have  with your health care provider. °Document Released: 02/09/2013 Document Revised: 09/24/2015 Document Reviewed: 08/11/2015 °Elsevier Interactive Patient Education © 2019 [employer] °Femoral Site Care °This sheet gives you information about how to care for yourself after your procedure. Your health care provider may also give you more specific instructions. If you have problems or questions, contact your health care provider. °What can I expect after the procedure? °After the procedure, it is common to have: °· Bruising that usually fades within 1-2 weeks. °· Tenderness at the site. °Follow these instructions at home: °Wound care °· Follow instructions from your health care provider about how to take care of your insertion site. Make sure you: °? Wash your hands with soap and water before you change your bandage (dressing). If soap and water are not available, use hand sanitizer. °? Change your dressing as told by your health care provider. °? Leave stitches (sutures), skin glue, or adhesive strips in place. These skin closures may need to stay in place for 2 weeks or longer. If adhesive strip edges start to loosen and curl up, you may trim the loose edges. Do not remove adhesive strips completely unless your health care provider tells you to do that. °· Do not take baths, swim, or use a hot tub until your health care provider approves. °· You may shower 24-48 hours after the procedure or as told by your health care provider. °? Gently wash the site with plain soap and water. °? Pat the area dry with a clean towel. °? Do not rub the site. This may cause   bleeding. °· Do not apply powder or lotion to the site. Keep the site clean and dry. °· Check your femoral site every day for signs of infection. Check for: °? Redness, swelling, or pain. °? Fluid or blood. °? Warmth. °? Pus or a bad smell. °Activity °· For the first 2-3 days after your procedure, or as long as directed: °? Avoid climbing stairs as much as  possible. °? Do not squat. °· Do not lift anything that is heavier than 10 lb (4.5 kg), or the limit that you are told, until your health care provider says that it is safe. °· Rest as directed. °? Avoid sitting for a long time without moving. Get up to take short walks every 1-2 hours. °· Do not drive for 24 hours if you were given a medicine to help you relax (sedative). °General instructions °· Take over-the-counter and prescription medicines only as told by your health care provider. °· Keep all follow-up visits as told by your health care provider. This is important. °Contact a health care provider if you have: °· A fever or chills. °· You have redness, swelling, or pain around your insertion site. °Get help right away if: °· The catheter insertion area swells very fast. °· You pass out. °· You suddenly start to sweat or your skin gets clammy. °· The catheter insertion area is bleeding, and the bleeding does not stop when you hold steady pressure on the area. °· The area near or just beyond the catheter insertion site becomes pale, cool, tingly, or numb. °These symptoms may represent a serious problem that is an emergency. Do not wait to see if the symptoms will go away. Get medical help right away. Call your local emergency services (911 in the [place] Do not drive yourself to the hospital. °Summary °· After the procedure, it is common to have bruising that usually fades within 1-2 weeks. °· Check your femoral site every day for signs of infection. °· Do not lift anything that is heavier than 10 lb (4.5 kg), or the limit that you are told, until your health care provider says that it is safe. °This information is not intended to replace advice given to you by your health care provider. Make sure you discuss any questions you have with your health care provider. °Document Released: 12/23/2013 Document Revised: 05/04/2017 Document Reviewed: 05/04/2017 °Elsevier Interactive Patient Education © 2019 Elsevier  Inc. ° °

## 2018-05-13 NOTE — Progress Notes (Signed)
Patient post angiogram per Dr Dew,tolerated well. Vitals stable at this time. Awake/alert and oriented. Denies complaints. No bleeding nor hematoma at right groin site. Sinus rhythm per monitor. Taking po's without difficulty.

## 2018-05-13 NOTE — H&P (Signed)
[facility] History & Physical Update  The patient was interviewed and re-examined.  The patient's previous History and Physical has been reviewed and is unchanged.  There is no change in the plan of care. We plan to proceed with the scheduled procedure.  Leotis Pain, MD  05/13/2018, 8:40 AM

## 2018-05-13 NOTE — Progress Notes (Signed)
Taking po's without difficulty,given norco 1 po for 4/10 lower abdominal discomfort,vitals stable no bleeding nor hematoma at right groin site. Discharge instructions given with questions answered.

## 2018-05-13 NOTE — Op Note (Signed)
[facility] Percutaneous Study/Intervention Procedural Note   Date of Surgery: 05/13/2018  Surgeon(s): Leotis Pain  Assistants:none  Pre-operative Diagnosis: Splenic artery aneurysm  Post-operative diagnosis: Same  Procedure(s) Performed: 1. Ultrasound guidance for vascular access right femoral artery 2. Catheter placement into terminal branches of the splenic artery and the splenic hilum from right femoral approach 3. Aortogram and selective celiac artery and splenic artery angiograms 4. Coil embolization of the splenic artery both proximal and distal to the aneurysm as well as the aneurysm using a series of Ruby coils 5. StarClose closure device right femoral artery  EBL: 10 cc  Contrast: 50 cc  Fluoro Time: 11.9 min  Moderate Conscious Sedation time: approximately 45 minutes using 6 mg of Versed and 200 Mcg of Fentanyl  Indications: Patient is a 53 y.o. female with a greater than 3 cm splenic artery aneurysm. The patient has CT scan study showing the splenic artery aneurysm out near the hilum of the spleen. The patient is brought in for angiography for further evaluation and potential treatment. Risks and benefits are discussed and informed consent is obtained  Procedure: The patient was identified and appropriate procedural time out was performed. The patient was then placed supine on the table and prepped and draped in the usual sterile fashion. Moderate conscious sedation was administered throughout the procedure with a face to face encounter with the patient throughout and with my supervision of the RN administering medicines and monitoring the patients vital signs and mental status throughout from the start of the procedure until the patient was taken to the recovery room.  Ultrasound was used to evaluate the right common femoral artery. It was patent . A digital  ultrasound image was acquired. A Seldinger needle was used to access the right common femoral artery under direct ultrasound guidance and a permanent image was performed. A 0.035 J wire was advanced without resistance and a 5Fr sheath was placed. Pigtail catheter was placed into the aorta and an AP aortogram was performed. This demonstrated normal renal arteries and normal aorta and iliac segments without significant stenosis. I then gave a small dose of heparin and used a V S1 catheter to selectively cannulate the celiac artery.  This was done without difficulty and a steep oblique projection.  Selective imaging of the celiac artery was then performed.There was a typical trifurcation of the celiac artery with a splenic artery originally traversing to the right and then coursing back to the left and being markedly tortuous.  There was a very large splenic artery aneurysm in the mid to distal main splenic artery.  I then used a Glidewire and the V S1 catheter to track well out the main splenic artery.  I then exchanged for a Magic torque wire and placed a 6 [place] Ansell sheath from the right femoral location up into the celiac artery and the first 4 to 5 cm of the splenic artery.  The splenic artery was incredibly tortuous and using a pro-grate microcatheter and a 65 cm Kumpe catheter I was able to navigate into the aneurysm and across the aneurysm parking the pro-grate catheter in the terminal branches of the splenic artery into the hilum.  Imaging confirmed intraluminal location and then we proceeded with treatment.  I began by packing Ruby coils starting at the termination of the main splenic artery and into the hilar splenic artery branches.  The first coil was an 8 x 25 standard and then a pod 8 was used to help  fill that distal splenic artery beyond the aneurysm.  A series of large coils were then used within the aneurysm itself including a 20 x 60 soft and a 24 x 60 standard and a 45 and a 60 cm packing  coil.  A 10 mm x 35 cm standard coil was also placed.  This resulted in excellent filling of the aneurysm itself.  I then began the coil embolization of the splenic artery proximal to the aneurysm.  Another 10 mm diameter by 35 cm standard coil was then placed and then the same size soft coil was placed.  The most proximally placed coil was a 9 mm x 30 standard which packed back at least 5 cm proximal to the splenic artery aneurysm just before a small branch and before the pancreaticoduodenal branches.  At this point, I was very pleased with the result and selective imaging through the sheath showed excellent coil embolization of the splenic artery aneurysm itself as well as the proximal and distal arteries.  I elected to terminate the procedure. The sheath was removed and StarClose closure device was deployed in the right femoral artery with excellent hemostatic result. The patient was taken to the recovery room in stable condition having tolerated the procedure well.  Findings:  Aortogram:Aorta and iliac arteries without significant stenosis.  Renal arteries were widely patent.  The SMA and celiac artery appeared to have brisk flow although their origins were not well seen on the AP aortogram  Celiac/splenic artery: There was a typical trifurcation of the celiac artery with a splenic artery originally traversing to the right and then coursing back to the left and being markedly tortuous.  There was a very large splenic artery aneurysm in the mid to distal main splenic artery.    Disposition: Patient was taken to the recovery room in stable condition having tolerated the procedure well.  Complications: None  Leotis Pain 05/13/2018 10:12 AM     This note was created with Dragon Medical transcription system. Any errors in dictation are purely unintentional.

## 2018-05-18 ENCOUNTER — Ambulatory Visit (INDEPENDENT_AMBULATORY_CARE_PROVIDER_SITE_OTHER): Payer: BLUE CROSS/BLUE SHIELD | Admitting: Family Medicine

## 2018-05-18 ENCOUNTER — Encounter: Payer: Self-pay | Admitting: Family Medicine

## 2018-05-18 VITALS — BP 138/84 | HR 97 | Temp 98.2°F | Ht 65.0 in | Wt 149.9 lb

## 2018-05-18 DIAGNOSIS — Z1239 Encounter for other screening for malignant neoplasm of breast: Secondary | ICD-10-CM

## 2018-05-18 DIAGNOSIS — M25511 Pain in right shoulder: Secondary | ICD-10-CM

## 2018-05-18 DIAGNOSIS — Z9889 Other specified postprocedural states: Secondary | ICD-10-CM | POA: Diagnosis not present

## 2018-05-18 DIAGNOSIS — Z1211 Encounter for screening for malignant neoplasm of colon: Secondary | ICD-10-CM

## 2018-05-18 DIAGNOSIS — K635 Polyp of colon: Secondary | ICD-10-CM | POA: Insufficient documentation

## 2018-05-18 DIAGNOSIS — T8484XA Pain due to internal orthopedic prosthetic devices, implants and grafts, initial encounter: Secondary | ICD-10-CM

## 2018-05-18 DIAGNOSIS — M79644 Pain in right finger(s): Secondary | ICD-10-CM

## 2018-05-18 DIAGNOSIS — G8929 Other chronic pain: Secondary | ICD-10-CM

## 2018-05-18 DIAGNOSIS — M47812 Spondylosis without myelopathy or radiculopathy, cervical region: Secondary | ICD-10-CM | POA: Insufficient documentation

## 2018-05-18 DIAGNOSIS — Z720 Tobacco use: Secondary | ICD-10-CM | POA: Diagnosis not present

## 2018-05-18 DIAGNOSIS — R202 Paresthesia of skin: Secondary | ICD-10-CM | POA: Diagnosis not present

## 2018-05-18 DIAGNOSIS — Z9089 Acquired absence of other organs: Secondary | ICD-10-CM

## 2018-05-18 DIAGNOSIS — E890 Postprocedural hypothyroidism: Secondary | ICD-10-CM

## 2018-05-18 DIAGNOSIS — D72829 Elevated white blood cell count, unspecified: Secondary | ICD-10-CM

## 2018-05-18 DIAGNOSIS — M4722 Other spondylosis with radiculopathy, cervical region: Secondary | ICD-10-CM

## 2018-05-18 DIAGNOSIS — Z13220 Encounter for screening for lipoid disorders: Secondary | ICD-10-CM

## 2018-05-18 DIAGNOSIS — D122 Benign neoplasm of ascending colon: Secondary | ICD-10-CM

## 2018-05-18 LAB — UA/M W/RFLX CULTURE, ROUTINE
Bilirubin, UA: NEGATIVE
GLUCOSE, UA: NEGATIVE
Ketones, UA: NEGATIVE
Leukocytes, UA: NEGATIVE
Nitrite, UA: NEGATIVE
Protein, UA: NEGATIVE
RBC UA: NEGATIVE
Urobilinogen, Ur: 0.2 mg/dL (ref 0.2–1.0)
pH, UA: 6 (ref 5.0–7.5)

## 2018-05-18 LAB — BAYER DCA HB A1C WAIVED: HB A1C: 5.1 % (ref <7.0)

## 2018-05-18 NOTE — Patient Instructions (Signed)
Please call Sylvia @ [phone] to schedule your mammogram.   

## 2018-05-18 NOTE — Progress Notes (Signed)
BP 138/84   Pulse 97   Temp 98.2 F (36.8 C) (Oral)   Ht 5\' 5"  (1.651 m)   Wt 149 lb 14.4 oz (68 kg)   SpO2 98%   BMI 24.94 kg/m    Subjective:    Patient ID: [name], female    DOB: 1965-10-20, 53 y.o.   MRN: [id number]  HPI: Crystal Carson is a 53 y.o. female who presents today to establish care. She last saw doctor in [place], [place] about 3 years ago. Had been seeing a doctor in [place] for quite a while, hasn't seen one regularly for a while. Saw the Dr. In [place] 1x- enough to get a few tests ordered, but then did not go back  Chief Complaint  Patient presents with  . New Patient (Initial Visit)  . Abdominal Pain    Thinks colon polyps are back  . Carpal Tunnel    Right hand. x 1 year  . Shoulder Pain    pt believes its related to Capal Tunnel. Loss of range of motion.   . Over Active Bladder   Has a history of thyroidectomy. Is not on thyroid replacement. Has gained 40 lbs in the last 2 years.   Has been having some pain in her belly- feels like when she had polyps in the past. Had a colonoscopy 3 years ago and was advised that she needed a repeat in 3 years. Does not know what type of polyps she had. Has been having aching, sore pain on the R side. She states that this is exactly the same pain she had when she had the polyps in the past and would like to get back into GI. She states that there was some concern that if she continued with polyps that she may need to have a partial colectomy.  R thumb pain- she feels like this is carpal tunnel. She notes that she does not have any numbness or tingling. She has been having discomfort and decreased range of motion in her thumb and it used to pop, but it doesn't pop any more. Now just does move. She is very frustrated by this.   SHOULDER PAIN- started with a tightness in her forearm that seemed to go up into her arm and shoulder, now in the shoulder with pain and loss of motion. Hurt her neck years ago. Had degenerative disc in her  neck and had surgery on that 2005/6 Duration: couple of months Involved shoulder: right Mechanism of injury: unknown Location: diffuse Onset:gradual Severity: 6/10  Quality:  Aching and sharp with movement Frequency: constant Radiation: yes into the arm Aggravating factors: movement  Alleviating factors: nothing  Status: worse Treatments attempted: ibuprofen  Relief with NSAIDs?:  mild Weakness: yes Numbness: no Decreased grip strength: yes Redness: no Swelling: no Bruising: no Fevers: no   Depression screen PHQ 2/9 05/19/2018  Decreased Interest 2  Down, Depressed, Hopeless 0  PHQ - 2 Score 2  Altered sleeping 3  Tired, decreased energy 3  Change in appetite 2  Feeling bad or failure about yourself  0  Trouble concentrating 0  Moving slowly or fidgety/restless 0  Suicidal thoughts 0  PHQ-9 Score 10  Difficult doing work/chores Somewhat difficult   GAD 7 : Generalized Anxiety Score 05/19/2018  Nervous, Anxious, on Edge 0  Control/stop worrying 0  Worry too much - different things 0  Trouble relaxing 0  Restless 0  Easily annoyed or irritable 0  Afraid - awful might happen 0  Total GAD 7 Score 0  Anxiety Difficulty Not difficult at all    Active Ambulatory Problems    Diagnosis Date Noted  . Tobacco abuse 04/20/2018  . Splenic artery aneurysm ([place]) 04/20/2018  . S/P thyroidectomy 05/18/2018  . DJD (degenerative joint disease) of cervical spine 05/18/2018  . Polyp of ascending colon 05/18/2018  . Painful orthopaedic hardware ([place]) 05/23/2018   Resolved Ambulatory Problems    Diagnosis Date Noted  . Chest pain 05/10/2018   Past Medical History:  Diagnosis Date  . Thyroid disease    Past Surgical History:  Procedure Laterality Date  . ABDOMINAL HYSTERECTOMY    . APPENDECTOMY    . CESAREAN SECTION    . CHOLECYSTECTOMY    . NECK SURGERY    . SPINE SURGERY    . THYROIDECTOMY    . TUBAL LIGATION    . VISCERAL ANGIOGRAPHY N/A 05/13/2018   Procedure:  VISCERAL ANGIOGRAPHY;  Surgeon: Algernon Huxley, MD;  Location: [facility];  Service: Cardiovascular;  Laterality: N/A;   Outpatient Encounter Medications as of 05/18/2018  Medication Sig  . HYDROcodone-acetaminophen (NORCO) 5-325 MG tablet Take 1 tablet by mouth every 6 (six) hours as needed for moderate pain.   No facility-administered encounter medications on file as of 05/18/2018.    No Known Allergies  Social History   Socioeconomic History  . Marital status: Married    Spouse name: Not on file  . Number of children: Not on file  . Years of education: Not on file  . Highest education level: Not on file  Occupational History  . Not on file  Social Needs  . Financial resource strain: Not on file  . Food insecurity:    Worry: Not on file    Inability: Not on file  . Transportation needs:    Medical: Not on file    Non-medical: Not on file  Tobacco Use  . Smoking status: Current Every Day Smoker    Packs/day: 1.00    Years: 40.00    Pack years: 40.00    Types: Cigarettes  . Smokeless tobacco: Never Used  Substance and Sexual Activity  . Alcohol use: Yes  . Drug use: Never  . Sexual activity: Not on file  Lifestyle  . Physical activity:    Days per week: Not on file    Minutes per session: Not on file  . Stress: Not on file  Relationships  . Social connections:    Talks on phone: Not on file    Gets together: Not on file    Attends religious service: Not on file    Active member of club or organization: Not on file    Attends meetings of clubs or organizations: Not on file    Relationship status: Not on file  Other Topics Concern  . Not on file  Social History Narrative  . Not on file   Family History  Problem Relation Age of Onset  . Hypertension Mother   . Stroke Mother 71  . Heart attack Paternal Grandfather   . Dementia Maternal Aunt   . Dementia Maternal Grandmother     Review of Systems  Constitutional: Negative.   Respiratory: Negative.    Cardiovascular: Negative.   Gastrointestinal: Positive for abdominal pain. Negative for abdominal distention, anal bleeding, blood in stool, constipation, diarrhea, nausea, rectal pain and vomiting.  Endocrine: Negative.   Musculoskeletal: Positive for arthralgias, myalgias, neck pain and neck stiffness. Negative for back pain, gait problem and  joint swelling.  Skin: Negative.   Neurological: Negative.   Psychiatric/Behavioral: Negative.     Per HPI unless specifically indicated above     Objective:    BP 138/84   Pulse 97   Temp 98.2 F (36.8 C) (Oral)   Ht 5\' 5"  (1.651 m)   Wt 149 lb 14.4 oz (68 kg)   SpO2 98%   BMI 24.94 kg/m   Wt Readings from Last 3 Encounters:  05/18/18 149 lb 14.4 oz (68 kg)  05/13/18 198 lb 6.6 oz (90 kg)  05/10/18 198 lb 4 oz (89.9 kg)    Physical Exam Vitals signs and nursing note reviewed.  Constitutional:      General: She is not in acute distress.    Appearance: Normal appearance. She is not ill-appearing, toxic-appearing or diaphoretic.  HENT:     Head: Normocephalic and atraumatic.     Right Ear: External ear normal.     Left Ear: External ear normal.     Nose: Nose normal.     Mouth/Throat:     Mouth: Mucous membranes are moist.     Pharynx: Oropharynx is clear.  Eyes:     General: No scleral icterus.       Right eye: No discharge.        Left eye: No discharge.     Extraocular Movements: Extraocular movements intact.     Conjunctiva/sclera: Conjunctivae normal.     Pupils: Pupils are equal, round, and reactive to light.  Neck:     Musculoskeletal: Normal range of motion and neck supple.  Cardiovascular:     Rate and Rhythm: Normal rate and regular rhythm.     Pulses: Normal pulses.     Heart sounds: Normal heart sounds. No murmur. No friction rub. No gallop.   Pulmonary:     Effort: Pulmonary effort is normal. No respiratory distress.     Breath sounds: Normal breath sounds. No stridor. No wheezing, rhonchi or rales.  Chest:      Chest wall: No tenderness.  Abdominal:     General: Abdomen is flat. There is no distension.     Palpations: Abdomen is soft. There is no shifting dullness, fluid wave, hepatomegaly, splenomegaly, mass or pulsatile mass.     Tenderness: There is abdominal tenderness in the right lower quadrant.  Musculoskeletal:     Right shoulder: She exhibits decreased range of motion, tenderness and pain. She exhibits no bony tenderness, no swelling, no effusion, no crepitus, no deformity, no laceration, no spasm, normal pulse and normal strength.  Skin:    General: Skin is warm and dry.     Capillary Refill: Capillary refill takes less than 2 seconds.     Coloration: Skin is not jaundiced or pale.     Findings: No bruising, erythema, lesion or rash.  Neurological:     General: No focal deficit present.     Mental Status: She is alert and oriented to person, place, and time. Mental status is at baseline.  Psychiatric:        Mood and Affect: Mood normal.        Behavior: Behavior normal.        Thought Content: Thought content normal.        Judgment: Judgment normal.     Results for orders placed or performed in visit on 05/18/18  CBC with Differential/Platelet  Result Value Ref Range   WBC 10.6 3.4 - 10.8 x10E3/uL   RBC 4.96 3.77 - 5.28  x10E6/uL   Hemoglobin 14.8 11.1 - 15.9 g/dL   Hematocrit 43.2 34.0 - 46.6 %   MCV 87 79 - 97 fL   MCH 29.8 26.6 - 33.0 pg   MCHC 34.3 31.5 - 35.7 g/dL   RDW 12.1 11.7 - 15.4 %   Platelets 303 150 - 450 x10E3/uL   Neutrophils 70 Not Estab. %   Lymphs 22 Not Estab. %   Monocytes 6 Not Estab. %   Eos 2 Not Estab. %   Basos 0 Not Estab. %   Neutrophils Absolute 7.4 (H) 1.4 - 7.0 x10E3/uL   Lymphocytes Absolute 2.4 0.7 - 3.1 x10E3/uL   Monocytes Absolute 0.7 0.1 - 0.9 x10E3/uL   EOS (ABSOLUTE) 0.2 0.0 - 0.4 x10E3/uL   Basophils Absolute 0.0 0.0 - 0.2 x10E3/uL   Immature Granulocytes 0 Not Estab. %   Immature Grans (Abs) 0.0 0.0 - 0.1 x10E3/uL  Bayer  DCA Hb A1c Waived  Result Value Ref Range   HB A1C (BAYER DCA - WAIVED) 5.1 <7.0 %  Comprehensive metabolic panel  Result Value Ref Range   Glucose 91 65 - 99 mg/dL   BUN 8 6 - 24 mg/dL   Creatinine, Ser 0.61 0.57 - 1.00 mg/dL   GFR calc non Af Amer 105 >59 mL/min/1.73   GFR calc Af Amer 121 >59 mL/min/1.73   BUN/Creatinine Ratio 13 9 - 23   Sodium 140 134 - 144 mmol/L   Potassium 4.0 3.5 - 5.2 mmol/L   Chloride 99 96 - 106 mmol/L   CO2 21 20 - 29 mmol/L   Calcium 9.8 8.7 - 10.2 mg/dL   Total Protein 7.6 6.0 - 8.5 g/dL   Albumin 4.7 3.5 - 5.5 g/dL   Globulin, Total 2.9 1.5 - 4.5 g/dL   Albumin/Globulin Ratio 1.6 1.2 - 2.2   Bilirubin Total 0.4 0.0 - 1.2 mg/dL   Alkaline Phosphatase 96 39 - 117 IU/L   AST 16 0 - 40 IU/L   ALT 24 0 - 32 IU/L  Lipid Panel w/o Chol/HDL Ratio  Result Value Ref Range   Cholesterol, Total 204 (H) 100 - 199 mg/dL   Triglycerides 96 0 - 149 mg/dL   HDL 46 >39 mg/dL   VLDL Cholesterol Cal 19 5 - 40 mg/dL   LDL Calculated 139 (H) 0 - 99 mg/dL  TSH  Result Value Ref Range   TSH 1.040 0.450 - 4.500 uIU/mL  UA/M w/rflx Culture, Routine  Result Value Ref Range   Specific Gravity, UA <1.005 (L) 1.005 - 1.030   pH, UA 6.0 5.0 - 7.5   Color, UA Yellow Yellow   Appearance Ur Clear Clear   Leukocytes, UA Negative Negative   Protein, UA Negative Negative/Trace   Glucose, UA Negative Negative   Ketones, UA Negative Negative   RBC, UA Negative Negative   Bilirubin, UA Negative Negative   Urobilinogen, Ur 0.2 0.2 - 1.0 mg/dL   Nitrite, UA Negative Negative  VITAMIN D 25 Hydroxy (Vit-D Deficiency, Fractures)  Result Value Ref Range   Vit D, 25-Hydroxy 17.7 (L) 30.0 - 100.0 ng/mL  B12 and Folate Panel  Result Value Ref Range   Vitamin B-12 434 232 - 1,245 pg/mL   Folate 10.1 >3.0 ng/mL  HIV Antibody (routine testing w rflx)  Result Value Ref Range   HIV Screen 4th Generation wRfx Non Reactive Non Reactive      Assessment & Plan:   Problem List  Items Addressed This Visit  Digestive   Polyp of ascending colon    Referral to GI made today.       Relevant Orders   Ambulatory referral to Gastroenterology     Musculoskeletal and Integument   DJD (degenerative joint disease) of cervical spine    Concerned that her shoulder pain is coming from her neck. Will obtain x-rays. Await results.       Relevant Orders   DG Cervical Spine Complete     Other   Tobacco abuse    Not interested in quitting right now. Will let us know if she would like help.       Relevant Orders   UA/M w/rflx Culture, Routine (Completed)   S/P thyroidectomy - Primary    Partial thyroidectomy. Will check labs today. Await results.       Relevant Orders   TSH (Completed)   Painful orthopaedic hardware [facility])    Has been moving around a bit, had foot surgery, needed to have hardware removed. Will refer back to podiatry. Referral generated today.      Relevant Orders   Ambulatory referral to Podiatry    Other Visit Diagnoses    Breast cancer screening       Mammogram ordered today.   Relevant Orders   MM DIGITAL SCREENING BILATERAL   Colon cancer screening       Referral to GI made today.   Relevant Orders   Ambulatory referral to Gastroenterology   Paresthesias       Will check labs today. Await results. Call with any concerns.    Relevant Orders   CBC with Differential/Platelet (Completed)   Bayer DCA Hb A1c Waived (Completed)   Comprehensive metabolic panel (Completed)   TSH (Completed)   VITAMIN D 25 Hydroxy (Vit-D Deficiency, Fractures) (Completed)   B12 and Folate Panel (Completed)   HIV Antibody (routine testing w rflx) (Completed)   Screening for cholesterol level       Labs drawn today. Await results.    Relevant Orders   Lipid Panel w/o Chol/HDL Ratio (Completed)   Chronic right shoulder pain       Seems to have a frozen shoulder. Will obtain x-rays and treat as needed. Likely needs referral to orthopedics.   Relevant  Orders   DG Cervical Spine Complete   DG Shoulder Right   Chronic pain of right thumb       This seems to be arthritis rather than carpal tunnel. Will check x-ray. Await results. Treat as needed.    Relevant Orders   DG Hand Complete Right   Leukocytosis, unspecified type       Rechecking levels today. Await results.        Follow up plan: Return ASAP Physical, for Records release Harrel Lemon, MD.

## 2018-05-19 LAB — CBC WITH DIFFERENTIAL/PLATELET
BASOS ABS: 0 10*3/uL (ref 0.0–0.2)
Basos: 0 %
EOS (ABSOLUTE): 0.2 10*3/uL (ref 0.0–0.4)
Eos: 2 %
Hematocrit: 43.2 % (ref 34.0–46.6)
Hemoglobin: 14.8 g/dL (ref 11.1–15.9)
Immature Grans (Abs): 0 10*3/uL (ref 0.0–0.1)
Immature Granulocytes: 0 %
Lymphocytes Absolute: 2.4 10*3/uL (ref 0.7–3.1)
Lymphs: 22 %
MCH: 29.8 pg (ref 26.6–33.0)
MCHC: 34.3 g/dL (ref 31.5–35.7)
MCV: 87 fL (ref 79–97)
Monocytes Absolute: 0.7 10*3/uL (ref 0.1–0.9)
Monocytes: 6 %
NEUTROS PCT: 70 %
Neutrophils Absolute: 7.4 10*3/uL — ABNORMAL HIGH (ref 1.4–7.0)
Platelets: 303 10*3/uL (ref 150–450)
RBC: 4.96 x10E6/uL (ref 3.77–5.28)
RDW: 12.1 % (ref 11.7–15.4)
WBC: 10.6 10*3/uL (ref 3.4–10.8)

## 2018-05-19 LAB — COMPREHENSIVE METABOLIC PANEL
ALT: 24 IU/L (ref 0–32)
AST: 16 IU/L (ref 0–40)
Albumin/Globulin Ratio: 1.6 (ref 1.2–2.2)
Albumin: 4.7 g/dL (ref 3.5–5.5)
Alkaline Phosphatase: 96 IU/L (ref 39–117)
BUN/Creatinine Ratio: 13 (ref 9–23)
BUN: 8 mg/dL (ref 6–24)
Bilirubin Total: 0.4 mg/dL (ref 0.0–1.2)
CO2: 21 mmol/L (ref 20–29)
Calcium: 9.8 mg/dL (ref 8.7–10.2)
Chloride: 99 mmol/L (ref 96–106)
Creatinine, Ser: 0.61 mg/dL (ref 0.57–1.00)
GFR calc Af Amer: 121 mL/min/{1.73_m2} (ref >59)
GFR, EST NON AFRICAN AMERICAN: 105 mL/min/{1.73_m2} (ref >59)
GLUCOSE: 91 mg/dL (ref 65–99)
Globulin, Total: 2.9 g/dL (ref 1.5–4.5)
Potassium: 4 mmol/L (ref 3.5–5.2)
Sodium: 140 mmol/L (ref 134–144)
Total Protein: 7.6 g/dL (ref 6.0–8.5)

## 2018-05-19 LAB — TSH: TSH: 1.04 u[IU]/mL (ref 0.450–4.500)

## 2018-05-19 LAB — LIPID PANEL W/O CHOL/HDL RATIO
Cholesterol, Total: 204 mg/dL — ABNORMAL HIGH (ref 100–199)
HDL: 46 mg/dL (ref >39)
LDL Calculated: 139 mg/dL — ABNORMAL HIGH (ref 0–99)
Triglycerides: 96 mg/dL (ref 0–149)
VLDL CHOLESTEROL CAL: 19 mg/dL (ref 5–40)

## 2018-05-19 LAB — B12 AND FOLATE PANEL
Folate: 10.1 ng/mL (ref >3.0)
Vitamin B-12: 434 pg/mL (ref 232–1245)

## 2018-05-19 LAB — VITAMIN D 25 HYDROXY (VIT D DEFICIENCY, FRACTURES): Vit D, 25-Hydroxy: 17.7 ng/mL — ABNORMAL LOW (ref 30.0–100.0)

## 2018-05-19 LAB — HIV ANTIBODY (ROUTINE TESTING W REFLEX): HIV Screen 4th Generation wRfx: NONREACTIVE

## 2018-05-20 ENCOUNTER — Encounter: Payer: Self-pay | Admitting: Family Medicine

## 2018-05-20 ENCOUNTER — Other Ambulatory Visit: Payer: Self-pay | Admitting: Family Medicine

## 2018-05-20 MED ORDER — VITAMIN D (ERGOCALCIFEROL) 1.25 MG (50000 UNIT) PO CAPS: 50000.0000 [IU] | ORAL_CAPSULE | ORAL | 0 refills | Status: AC

## 2018-05-23 ENCOUNTER — Encounter: Payer: Self-pay | Admitting: Family Medicine

## 2018-05-23 DIAGNOSIS — T8484XA Pain due to internal orthopedic prosthetic devices, implants and grafts, initial encounter: Secondary | ICD-10-CM | POA: Insufficient documentation

## 2018-05-23 NOTE — Assessment & Plan Note (Signed)
Has been moving around a bit, had foot surgery, needed to have hardware removed. Will refer back to podiatry. Referral generated today.

## 2018-05-23 NOTE — Assessment & Plan Note (Signed)
Partial thyroidectomy. Will check labs today. Await results.

## 2018-05-23 NOTE — Assessment & Plan Note (Signed)
Not interested in quitting right now. Will let us know if she would like help.

## 2018-05-23 NOTE — Assessment & Plan Note (Signed)
Concerned that her shoulder pain is coming from her neck. Will obtain x-rays. Await results.

## 2018-05-23 NOTE — Assessment & Plan Note (Signed)
Referral to GI made today. 

## 2018-05-31 ENCOUNTER — Other Ambulatory Visit: Payer: Self-pay

## 2018-05-31 ENCOUNTER — Encounter: Payer: Self-pay | Admitting: *Deleted

## 2018-05-31 ENCOUNTER — Telehealth: Payer: Self-pay

## 2018-05-31 DIAGNOSIS — Z86010 Personal history of colonic polyps: Secondary | ICD-10-CM

## 2018-05-31 NOTE — Telephone Encounter (Signed)
Patients call has been returned.  She has been scheduled for a colonoscopy with Dr. Allen Norris at [facility] on 06/18/18.  Thanks [employer]

## 2018-05-31 NOTE — Telephone Encounter (Signed)
PT  Left vm to schedule an apt

## 2018-06-01 ENCOUNTER — Encounter: Payer: BLUE CROSS/BLUE SHIELD | Admitting: Family Medicine

## 2018-06-02 ENCOUNTER — Telehealth: Payer: Self-pay

## 2018-06-02 NOTE — Telephone Encounter (Signed)
LVM with patient to contact office.  She is scheduled for 06/18/18 with Dr. Allen Norris in [facility] for her colonoscopy.  Dr. Allen Norris will be at a conference on this day.  I need to reschedule her. Please get me to the phone when she calls back.  Thanks [employer]

## 2018-06-09 ENCOUNTER — Encounter: Payer: Self-pay | Admitting: Podiatry

## 2018-06-09 ENCOUNTER — Ambulatory Visit (INDEPENDENT_AMBULATORY_CARE_PROVIDER_SITE_OTHER): Payer: BLUE CROSS/BLUE SHIELD | Admitting: Podiatry

## 2018-06-09 ENCOUNTER — Ambulatory Visit (INDEPENDENT_AMBULATORY_CARE_PROVIDER_SITE_OTHER): Payer: BLUE CROSS/BLUE SHIELD

## 2018-06-09 VITALS — BP 133/90 | HR 93 | Temp 98.4°F

## 2018-06-09 DIAGNOSIS — M779 Enthesopathy, unspecified: Secondary | ICD-10-CM

## 2018-06-09 DIAGNOSIS — M778 Other enthesopathies, not elsewhere classified: Secondary | ICD-10-CM

## 2018-06-09 DIAGNOSIS — L600 Ingrowing nail: Secondary | ICD-10-CM

## 2018-06-09 DIAGNOSIS — L603 Nail dystrophy: Secondary | ICD-10-CM | POA: Diagnosis not present

## 2018-06-09 DIAGNOSIS — T8484XA Pain due to internal orthopedic prosthetic devices, implants and grafts, initial encounter: Secondary | ICD-10-CM

## 2018-06-09 DIAGNOSIS — L608 Other nail disorders: Secondary | ICD-10-CM | POA: Diagnosis not present

## 2018-06-09 NOTE — Progress Notes (Signed)
Subjective:  Patient ID: Crystal Carson, female    DOB: [birth year],  MRN: [id number] HPI Chief Complaint  Patient presents with  . Foot Pain    Patient presents today to discuss removing hardware from previous surgery to right foot.  Her surgery was done 04/2009 and now she states "it feels like I can feel the head of the screw coming out of my foot"  She reports its painful with pressure to top of foot or with wearing shoes.  She has been taking Tylenol and Ibuprofen for relief  . Ingrown Toenail    she reports hx of ingrown toenails bilat hallux and they are now starting to become sore again.      53 y.o. female presents with the above complaint.   ROS: Denies fever chills nausea vomiting muscle aches pains calf pain back pain chest pain shortness of breath.  Past Medical History:  Diagnosis Date  . Thyroid disease    Past Surgical History:  Procedure Laterality Date  . ABDOMINAL HYSTERECTOMY    . APPENDECTOMY    . CESAREAN SECTION    . CHOLECYSTECTOMY    . NECK SURGERY    . SPINE SURGERY    . THYROIDECTOMY    . TUBAL LIGATION    . VISCERAL ANGIOGRAPHY N/A 05/13/2018   Procedure: VISCERAL ANGIOGRAPHY;  Surgeon: Algernon Huxley, MD;  Location: [facility];  Service: Cardiovascular;  Laterality: N/A;    Current Outpatient Medications:  Marland Kitchen  Vitamin D, Ergocalciferol, (DRISDOL) 1.25 MG (50000 UT) CAPS capsule, Take 1 capsule (50,000 Units total) by mouth every 7 (seven) days., Disp: 12 capsule, Rfl: 0  No Known Allergies Review of Systems Objective:   Vitals:   06/09/18 1333  BP: 133/90  Pulse: 93  Temp: 98.4 F (36.9 C)    General: Well developed, nourished, in no acute distress, alert and oriented x3   Dermatological: Skin is warm, dry and supple bilateral. Nails x 10 are well maintained; remaining integument appears unremarkable at this time. There are no open sores, no preulcerative lesions, no rash or signs of infection present.  Sharp incurvated nail margins  on the tibiofibular border of the hallux bilateral mild erythema.  Toenails are thickened and discolored.  Vascular: Dorsalis Pedis artery and Posterior Tibial artery pedal pulses are 2/4 bilateral with immedate capillary fill time. Pedal hair growth present. No varicosities and no lower extremity edema present bilateral.   Neruologic: Grossly intact via light touch bilateral. Vibratory intact via tuning fork bilateral. Protective threshold with Semmes Wienstein monofilament intact to all pedal sites bilateral. Patellar and Achilles deep tendon reflexes 2+ bilateral. No Babinski or clonus noted bilateral.   Musculoskeletal: No gross boney pedal deformities bilateral. No pain, crepitus, or limitation noted with foot and ankle range of motion bilateral. Muscular strength 5/5 in all groups tested bilateral.  Painful internal fixation on palpation of the proximal first metatarsal TMT.  Gait: Unassisted, Nonantalgic.    Radiographs:  Radiographs taken today demonstrate a prominent Synthes type screw and 2 K wires at the first TMT right foot.  Assessment & Plan:   Assessment: Possible onychomycosis.  Consented her today for removal of internal fixation first metatarsal left foot.  Also consented her for matrixectomy's tibiofibular border of the hallux bilaterally.   Plan: She was consented for removal of internal fixation samples of the toenails were taken today.  I also consented her for removal of the tibiofibular border the hallux bilateral.  She understands the possible complications that  occur with this special being a smoker she understands this and is amenable to it we dispensed information regarding the surgery center anesthesia and the morning of preop instructions.  Also dispensed to postop shoes I will follow-up with her in the near future.     Max T. [place], [place]

## 2018-06-09 NOTE — Addendum Note (Signed)
Addended by: Clovis Riley E on: 06/09/2018 03:06 PM   Modules accepted: Orders

## 2018-06-11 ENCOUNTER — Ambulatory Visit (INDEPENDENT_AMBULATORY_CARE_PROVIDER_SITE_OTHER): Payer: BLUE CROSS/BLUE SHIELD | Admitting: Vascular Surgery

## 2018-06-11 ENCOUNTER — Encounter (INDEPENDENT_AMBULATORY_CARE_PROVIDER_SITE_OTHER): Payer: Self-pay | Admitting: Vascular Surgery

## 2018-06-11 VITALS — BP 125/77 | HR 86 | Resp 16 | Ht 65.0 in | Wt 196.0 lb

## 2018-06-11 DIAGNOSIS — I728 Aneurysm of other specified arteries: Secondary | ICD-10-CM

## 2018-06-11 NOTE — Assessment & Plan Note (Signed)
The patient seems to be doing well status post splenic artery coil embolization for treatment of an aneurysm.  Doing well symptomatically.  We will try to see Crystal Carson back in 2 to 3 months with a mesenteric duplex to assess the aneurysm as well as determine what flow remains to the spleen.  The short gastrics are likely keeping the spleen open and keeping Crystal Carson from being very symptomatic.  She may resume all normal activities.

## 2018-06-11 NOTE — Progress Notes (Signed)
MRN : [id number]  Crystal Carson is a 53 y.o. (March 02, 1966) female who presents with chief complaint of  Chief Complaint  Patient presents with  . Follow-up  .  History of Present Illness: Patient returns today in follow up of her splenic artery aneurysm.  She is about a month status post coil embolization for treatment of her splenic artery aneurysm.  She really did not have any abdominal pain to speak of.  She had some tenderness and some neuropraxia in her right thigh from her access site that is slowly improving.  No fevers or chills.  Current Outpatient Medications  Medication Sig Dispense Refill  . Vitamin D, Ergocalciferol, (DRISDOL) 1.25 MG (50000 UT) CAPS capsule Take 1 capsule (50,000 Units total) by mouth every 7 (seven) days. (Patient not taking: Reported on 06/11/2018) 12 capsule 0   No current facility-administered medications for this visit.     Past Medical History:  Diagnosis Date  . Thyroid disease     Past Surgical History:  Procedure Laterality Date  . ABDOMINAL HYSTERECTOMY    . APPENDECTOMY    . CESAREAN SECTION    . CHOLECYSTECTOMY    . NECK SURGERY    . SPINE SURGERY    . THYROIDECTOMY    . TUBAL LIGATION    . VISCERAL ANGIOGRAPHY N/A 05/13/2018   Procedure: VISCERAL ANGIOGRAPHY;  Surgeon: Algernon Huxley, MD;  Location: [facility];  Service: Cardiovascular;  Laterality: N/A;    Family History No bleeding disorders, clotting disorders, aneurysms, or autoimmune diseases  Social History Social History       Tobacco Use  . Smoking status: Current Every Day Smoker  . Smokeless tobacco: Never Used  Substance Use Topics  . Alcohol use: Yes  . Drug use: Not on file    No Known Allergies   REVIEW OF SYSTEMS (Negative unless checked)  Constitutional: [] ?Weight loss  [] ?Fever  [] ?Chills Cardiac: [x] ?Chest pain   [] ?Chest pressure   [x] ?Palpitations   [] ?Shortness of breath when laying flat   [] ?Shortness of breath at rest    [x] ?Shortness of breath with exertion. Vascular:  [] ?Pain in legs with walking   [] ?Pain in legs at rest   [] ?Pain in legs when laying flat   [] ?Claudication   [] ?Pain in feet when walking  [] ?Pain in feet at rest  [] ?Pain in feet when laying flat   [] ?History of DVT   [] ?Phlebitis   [] ?Swelling in legs   [] ?Varicose veins   [] ?Non-healing ulcers Pulmonary:   [] ?Uses home oxygen   [] ?Productive cough   [] ?Hemoptysis   [] ?Wheeze  [] ?COPD   [] ?Asthma Neurologic:  [] ?Dizziness  [] ?Blackouts   [] ?Seizures   [] ?History of stroke   [] ?History of TIA  [] ?Aphasia   [] ?Temporary blindness   [] ?Dysphagia   [] ?Weakness or numbness in arms   [] ?Weakness or numbness in legs Musculoskeletal:  [] ?Arthritis   [] ?Joint swelling   [] ?Joint pain   [] ?Low back pain Hematologic:  [] ?Easy bruising  [] ?Easy bleeding   [] ?Hypercoagulable state   [] ?Anemic  [] ?Hepatitis Gastrointestinal:  [] ?Blood in stool   [] ?Vomiting blood  [] ?Gastroesophageal reflux/heartburn   [] ?Abdominal pain Genitourinary:  [] ?Chronic kidney disease   [] ?Difficult urination  [] ?Frequent urination  [] ?Burning with urination   [] ?Hematuria Skin:  [] ?Rashes   [] ?Ulcers   [] ?Wounds Psychological:  [] ?History of anxiety   [] ? History of major depression.     Physical Examination  BP 125/77 (BP Location: Right Arm, Patient Position: Sitting, Cuff Size: Large)  Pulse 86   Resp 16   Ht 5\' 5"  (1.651 m)   Wt 196 lb (88.9 kg)   BMI 32.62 kg/m  Gen:  WD/WN, NAD Head: [place]/AT, No temporalis wasting. Ear/Nose/Throat: Hearing grossly intact, nares w/o erythema or drainage Eyes: Conjunctiva clear. Sclera non-icteric Neck: Supple.  Trachea midline Pulmonary:  Good air movement, no use of accessory muscles.  Cardiac: RRR, no JVD Vascular:  Vessel Right Left  Radial Palpable Palpable                                   Gastrointestinal: soft, non-tender/non-distended. No guarding/reflex.  Musculoskeletal: M/S 5/5 throughout.  No deformity or  atrophy.  Neurologic: Sensation grossly intact in extremities.  Symmetrical.  Speech is fluent.  Psychiatric: Judgment intact, Mood & affect appropriate for pt's clinical situation. Dermatologic: No rashes or ulcers noted.  No cellulitis or open wounds.       Labs Recent Results (from the past 2160 hour(s))  Basic metabolic panel     Status: Abnormal   Collection Time: 04/15/18  5:58 PM  Result Value Ref Range   Sodium 138 135 - 145 mmol/L   Potassium 3.7 3.5 - 5.1 mmol/L   Chloride 106 98 - 111 mmol/L   CO2 24 22 - 32 mmol/L   Glucose, Bld 101 (H) 70 - 99 mg/dL   BUN 10 6 - 20 mg/dL   Creatinine, Ser 0.69 0.44 - 1.00 mg/dL   Calcium 8.8 (L) 8.9 - 10.3 mg/dL   GFR calc non Af Amer >60 >60 mL/min   GFR calc Af Amer >60 >60 mL/min   Anion gap 8 5 - 15    Comment: Performed at [facility], [address] [place], [place] [address]  CBC     Status: Abnormal   Collection Time: 04/15/18  5:58 PM  Result Value Ref Range   WBC 14.5 (H) 4.0 - 10.5 K/uL   RBC 4.84 3.87 - 5.11 MIL/uL   Hemoglobin 14.8 12.0 - 15.0 g/dL   HCT 45.1 36.0 - 46.0 %   MCV 93.2 80.0 - 100.0 fL   MCH 30.6 26.0 - 34.0 pg   MCHC 32.8 30.0 - 36.0 g/dL   RDW 12.5 11.5 - 15.5 %   Platelets 264 150 - 400 K/uL   nRBC 0.0 0.0 - 0.2 %    Comment: Performed at [facility], [address] [place], [place] [address]  Troponin I - ONCE - STAT     Status: None   Collection Time: 04/15/18  5:58 PM  Result Value Ref Range   Troponin I <0.03 <0.03 ng/mL    Comment: Performed at [facility], [address] [place], [place] [address]  Troponin I - ONCE - STAT     Status: None   Collection Time: 04/15/18  9:55 PM  Result Value Ref Range   Troponin I <0.03 <0.03 ng/mL    Comment: Performed at [facility], [address] [place], [place] [address]  BUN     Status: None   Collection Time: 05/13/18  8:15 AM  Result Value Ref Range   BUN 13 6 - 20 mg/dL    Comment: Performed  at [facility], [address] [place],  [address]  Creatinine, serum     Status: None   Collection Time: 05/13/18  8:15 AM  Result Value Ref Range   Creatinine,  Ser 0.57 0.44 - 1.00 mg/dL   GFR calc non Af Amer >60 >60 mL/min   GFR calc Af Amer >60 >60 mL/min    Comment: Performed at [facility], [address] [place], [place] [address]  [facility] Hb A1c Waived     Status: None   Collection Time: 05/18/18  2:46 PM  Result Value Ref Range   HB A1C (BAYER DCA - WAIVED) 5.1 <7.0 %    Comment:                                       Diabetic Adult            <7.0                                       Healthy Adult        4.3 - 5.7                                                           (DCCT/NGSP) American Diabetes Association's Summary of Glycemic Recommendations for Adults with Diabetes: Hemoglobin A1c <7.0%. More stringent glycemic goals (A1c <6.0%) may further reduce complications at the cost of increased risk of hypoglycemia.   UA/M w/rflx Culture, Routine     Status: Abnormal   Collection Time: 05/18/18  2:46 PM  Result Value Ref Range   Specific Gravity, UA <1.005 (L) 1.005 - 1.030   pH, UA 6.0 5.0 - 7.5   Color, UA Yellow Yellow   Appearance Ur Clear Clear   Leukocytes, UA Negative Negative   Protein, UA Negative Negative/Trace   Glucose, UA Negative Negative   Ketones, UA Negative Negative   RBC, UA Negative Negative   Bilirubin, UA Negative Negative   Urobilinogen, Ur 0.2 0.2 - 1.0 mg/dL   Nitrite, UA Negative Negative  CBC with Differential/Platelet     Status: Abnormal   Collection Time: 05/18/18  2:48 PM  Result Value Ref Range   WBC 10.6 3.4 - 10.8 x10E3/uL   RBC 4.96 3.77 - 5.28 x10E6/uL   Hemoglobin 14.8 11.1 - 15.9 g/dL   Hematocrit 43.2 34.0 - 46.6 %   MCV 87 79 - 97 fL   MCH 29.8 26.6 - 33.0 pg   MCHC 34.3 31.5 - 35.7 g/dL   RDW 12.1 11.7 - 15.4 %    Comment:               **Please note reference interval change**    Platelets 303 150 - 450 x10E3/uL   Neutrophils 70 Not Estab. %   Lymphs 22 Not Estab. %   Monocytes 6 Not Estab. %   Eos 2 Not Estab. %   Basos 0 Not Estab. %   Neutrophils Absolute 7.4 (H) 1.4 - 7.0 x10E3/uL   Lymphocytes Absolute 2.4 0.7 - 3.1 x10E3/uL   Monocytes Absolute 0.7 0.1 - 0.9 x10E3/uL   EOS (ABSOLUTE) 0.2 0.0 - 0.4 x10E3/uL   Basophils Absolute 0.0 0.0 - 0.2 x10E3/uL   Immature Granulocytes 0 Not Estab. %   Immature Grans (Abs) 0.0 0.0 - 0.1 x10E3/uL  Comprehensive  metabolic panel     Status: None   Collection Time: 05/18/18  2:48 PM  Result Value Ref Range   Glucose 91 65 - 99 mg/dL   BUN 8 6 - 24 mg/dL   Creatinine, Ser 0.61 0.57 - 1.00 mg/dL   GFR calc non Af Amer 105 >59 mL/min/1.73   GFR calc Af Amer 121 >59 mL/min/1.73   BUN/Creatinine Ratio 13 9 - 23   Sodium 140 134 - 144 mmol/L   Potassium 4.0 3.5 - 5.2 mmol/L   Chloride 99 96 - 106 mmol/L   CO2 21 20 - 29 mmol/L   Calcium 9.8 8.7 - 10.2 mg/dL   Total Protein 7.6 6.0 - 8.5 g/dL   Albumin 4.7 3.5 - 5.5 g/dL    Comment:     **Effective May 24, 2018 Albumin reference**       interval will be changing to:              Age                Female          Female           0 -  7 days        3.6 - 4.9      3.6 - 4.9           8 - 30 days        3.4 - 4.7      3.4 - 4.7           1 -  6 month       3.7 - 4.8      3.7 - 4.8    7 months -  2 years       3.9 - 5.0      3.9 - 5.0           3 -  5 years       4.0 - 5.0      4.0 - 5.0           6 - 12 years       4.1 - 5.0      4.0 - 5.0          13 - 30 years       4.1 - 5.2      3.9 - 5.0          31 - 50 years       4.0 - 5.0      3.8 - 4.8          51 - 60 years       3.8 - 4.9      3.8 - 4.9          61 - 70 years       3.8 - 4.8      3.8 - 4.8          71 - 80 years       3.7 - 4.7      3.7 - 4.7          81 - 89 years       3.6 - 4.6      3.6 - 4.6              >89 years       3.5 - 4.6      3.5 - 4.6    Globulin, Total 2.9 1.5 -  4.5 g/dL    Albumin/Globulin Ratio 1.6 1.2 - 2.2   Bilirubin Total 0.4 0.0 - 1.2 mg/dL   Alkaline Phosphatase 96 39 - 117 IU/L   AST 16 0 - 40 IU/L   ALT 24 0 - 32 IU/L  Lipid Panel w/o Chol/HDL Ratio     Status: Abnormal   Collection Time: 05/18/18  2:48 PM  Result Value Ref Range   Cholesterol, Total 204 (H) 100 - 199 mg/dL   Triglycerides 96 0 - 149 mg/dL   HDL 46 >39 mg/dL   VLDL Cholesterol Cal 19 5 - 40 mg/dL   LDL Calculated 139 (H) 0 - 99 mg/dL  TSH     Status: None   Collection Time: 05/18/18  2:48 PM  Result Value Ref Range   TSH 1.040 0.450 - 4.500 uIU/mL  VITAMIN D 25 Hydroxy (Vit-D Deficiency, Fractures)     Status: Abnormal   Collection Time: 05/18/18  2:48 PM  Result Value Ref Range   Vit D, 25-Hydroxy 17.7 (L) 30.0 - 100.0 ng/mL    Comment: Vitamin D deficiency has been defined by the [place] and an Endocrine Society practice guideline as a level of serum 25-OH vitamin D less than 20 ng/mL (1,2). The Endocrine Society went on to further define vitamin D insufficiency as a level between 21 and 29 ng/mL (2). 1. IOM (Institute of Medicine). 2010. Dietary reference    intakes for calcium and D. [place]: The    [employer]. 2. Holick MF, Binkley [place], Bischoff-Ferrari HA, et al.    Evaluation, treatment, and prevention of vitamin D    deficiency: an Endocrine Society clinical practice    guideline. JCEM. 2011 Jul; 96(7):1911-30.   B12 and Folate Panel     Status: None   Collection Time: 05/18/18  2:48 PM  Result Value Ref Range   Vitamin B-12 434 232 - 1,245 pg/mL   Folate 10.1 >3.0 ng/mL    Comment: A serum folate concentration of less than 3.1 ng/mL is considered to represent clinical deficiency.   HIV Antibody (routine testing w rflx)     Status: None   Collection Time: 05/18/18  2:48 PM  Result Value Ref Range   HIV Screen 4th Generation wRfx Non Reactive Non Reactive    Radiology Dg Foot Complete Right  Result Date:  06/09/2018 Please see detailed radiograph report in office note.   Assessment/Plan  Splenic artery aneurysm [facility]) The patient seems to be doing well status post splenic artery coil embolization for treatment of an aneurysm.  Doing well symptomatically.  We will try to see her back in 2 to 3 months with a mesenteric duplex to assess the aneurysm as well as determine what flow remains to the spleen.  The short gastrics are likely keeping the spleen open and keeping her from being very symptomatic.  She may resume all normal activities.    Leotis Pain, MD  06/11/2018 10:43 AM    This note was created with Dragon medical transcription system.  Any errors from dictation are purely unintentional

## 2018-06-14 ENCOUNTER — Encounter: Payer: Self-pay | Admitting: Family Medicine

## 2018-06-14 ENCOUNTER — Ambulatory Visit (INDEPENDENT_AMBULATORY_CARE_PROVIDER_SITE_OTHER): Payer: BLUE CROSS/BLUE SHIELD | Admitting: Family Medicine

## 2018-06-14 ENCOUNTER — Telehealth: Payer: Self-pay

## 2018-06-14 ENCOUNTER — Other Ambulatory Visit: Payer: Self-pay

## 2018-06-14 VITALS — BP 125/85 | HR 97 | Temp 98.1°F | Ht 65.0 in | Wt 199.9 lb

## 2018-06-14 DIAGNOSIS — Z23 Encounter for immunization: Secondary | ICD-10-CM | POA: Diagnosis not present

## 2018-06-14 DIAGNOSIS — Z0000 Encounter for general adult medical examination without abnormal findings: Secondary | ICD-10-CM

## 2018-06-14 DIAGNOSIS — M2550 Pain in unspecified joint: Secondary | ICD-10-CM

## 2018-06-14 DIAGNOSIS — R232 Flushing: Secondary | ICD-10-CM

## 2018-06-14 DIAGNOSIS — W57XXXA Bitten or stung by nonvenomous insect and other nonvenomous arthropods, initial encounter: Secondary | ICD-10-CM

## 2018-06-14 DIAGNOSIS — Z720 Tobacco use: Secondary | ICD-10-CM

## 2018-06-14 DIAGNOSIS — Z86010 Personal history of colonic polyps: Secondary | ICD-10-CM

## 2018-06-14 MED ORDER — VARENICLINE TARTRATE 0.5 MG PO TABS: 0.5000 mg | ORAL_TABLET | Freq: Two times a day (BID) | ORAL | 3 refills | Status: DC

## 2018-06-14 MED ORDER — VARENICLINE TARTRATE 0.5 MG X 11 & 1 MG X 42 PO MISC: ORAL | 0 refills | Status: DC

## 2018-06-14 MED ORDER — TRIAMCINOLONE ACETONIDE 0.1 % EX CREA: 1.0000 "application " | TOPICAL_CREAM | Freq: Two times a day (BID) | CUTANEOUS | 3 refills | Status: AC

## 2018-06-14 NOTE — Patient Instructions (Addendum)
[facility] at [facility]  Address: [address] [place], [place], [place] [address]  Phone: [phone]   Menopause Menopause is the normal time of life when menstrual periods stop completely. It is usually confirmed by 12 months without a menstrual period. The transition to menopause (perimenopause) most often happens between the ages of 80 and 27. During perimenopause, hormone levels change in your body, which can cause symptoms and affect your health. Menopause may increase your risk for:  Loss of bone (osteoporosis), which causes bone breaks (fractures).  Depression.  Hardening and narrowing of the arteries (atherosclerosis), which can cause heart attacks and strokes. What are the causes? This condition is usually caused by a natural change in hormone levels that happens as you get older. The condition may also be caused by surgery to remove both ovaries (bilateral oophorectomy). What increases the risk? This condition is more likely to start at an earlier age if you have certain medical conditions or treatments, including:  A tumor of the pituitary gland in the brain.  A disease that affects the ovaries and hormone production.  Radiation treatment for cancer.  Certain cancer treatments, such as chemotherapy or hormone (anti-estrogen) therapy.  Heavy smoking and excessive alcohol use.  Family history of early menopause. This condition is also more likely to develop earlier in women who are very thin. What are the signs or symptoms? Symptoms of this condition include:  Hot flashes.  Irregular menstrual periods.  Night sweats.  Changes in feelings about sex. This could be a decrease in sex drive or an increased comfort around your sexuality.  Vaginal dryness and thinning of the vaginal walls. This may cause painful intercourse.  Dryness of the skin and development of wrinkles.  Headaches.  Problems sleeping (insomnia).  Mood swings or  irritability.  Memory problems.  Weight gain.  Hair growth on the face and chest.  Bladder infections or problems with urinating. How is this diagnosed? This condition is diagnosed based on your medical history, a physical exam, your age, your menstrual history, and your symptoms. Hormone tests may also be done. How is this treated? In some cases, no treatment is needed. You and your health care provider should make a decision together about whether treatment is necessary. Treatment will be based on your individual condition and preferences. Treatment for this condition focuses on managing symptoms. Treatment may include:  Menopausal hormone therapy (MHT).  Medicines to treat specific symptoms or complications.  Acupuncture.  Vitamin or herbal supplements. Before starting treatment, make sure to let your health care provider know if you have a personal or family history of:  Heart disease.  Breast cancer.  Blood clots.  Diabetes.  Osteoporosis. Follow these instructions at home: Lifestyle  Do not use any products that contain nicotine or tobacco, such as cigarettes and e-cigarettes. If you need help quitting, ask your health care provider.  Get at least 30 minutes of physical activity on 5 or more days each week.  Avoid alcoholic and caffeinated beverages, as well as spicy foods. This may help prevent hot flashes.  Get 7-8 hours of sleep each night.  If you have hot flashes, try: ? Dressing in layers. ? Avoiding things that may trigger hot flashes, such as spicy food, warm places, or stress. ? Taking slow, deep breaths when a hot flash starts. ? Keeping a fan in your home and office.  Find ways to manage stress, such as deep breathing, meditation, or journaling.  Consider going to  group therapy with other women who are having menopause symptoms. Ask your health care provider about recommended group therapy meetings. Eating and drinking  Eat a healthy, balanced  diet that contains whole grains, lean protein, low-fat dairy, and plenty of fruits and vegetables.  Your health care provider may recommend adding more soy to your diet. Foods that contain soy include tofu, tempeh, and soy milk.  Eat plenty of foods that contain calcium and vitamin D for bone health. Items that are rich in calcium include low-fat milk, yogurt, beans, almonds, sardines, broccoli, and kale. Medicines  Take over-the-counter and prescription medicines only as told by your health care provider.  Talk with your health care provider before starting any herbal supplements. If prescribed, take vitamins and supplements as told by your health care provider. These may include: ? Calcium. Women age 38 and older should get 1,200 mg (milligrams) of calcium every day. ? Vitamin D. Women need 600-800 International Units of vitamin D each day. ? Vitamins B12 and B6. Aim for 50 micrograms of B12 and 1.5 mg of B6 each day. General instructions  Keep track of your menstrual periods, including: ? When they occur. ? How heavy they are and how long they last. ? How much time passes between periods.  Keep track of your symptoms, noting when they start, how often you have them, and how long they last.  Use vaginal lubricants or moisturizers to help with vaginal dryness and improve comfort during sex.  Keep all follow-up visits as told by your health care provider. This is important. This includes any group therapy or counseling. Contact a health care provider if:  You are still having menstrual periods after age 32.  You have pain during sex.  You have not had a period for 12 months and you develop vaginal bleeding. Get help right away if:  You have: ? Severe depression. ? Excessive vaginal bleeding. ? Pain when you urinate. ? A fast or irregular heart beat (palpitations). ? Severe headaches. ? Abdomen (abdominal) pain or severe indigestion.  You fell and you think you have a broken  bone.  You develop leg or chest pain.  You develop vision problems.  You feel a lump in your breast. Summary  Menopause is the normal time of life when menstrual periods stop completely. It is usually confirmed by 12 months without a menstrual period.  The transition to menopause (perimenopause) most often happens between the ages of 58 and 50.  Symptoms can be managed through medicines, lifestyle changes, and complementary therapies such as acupuncture.  Eat a balanced diet that is rich in nutrients to promote bone health and heart health and to manage symptoms during menopause. This information is not intended to replace advice given to you by your health care provider. Make sure you discuss any questions you have with your health care provider. Document Released: 07/12/2003 Document Revised: 05/24/2016 Document Reviewed: 05/24/2016 Elsevier Interactive Patient Education  2019 [employer]. Tdap Vaccine (Tetanus, Diphtheria and Pertussis): What You Need to Know 1. Why get vaccinated? Tetanus, diphtheria and pertussis are very serious diseases. Tdap vaccine can protect [place] from these diseases. And, Tdap vaccine given to pregnant women can protect newborn babies against pertussis.Marland Kitchen TETANUS (Lockjaw) is rare in the [place] today. It causes painful muscle tightening and stiffness, usually all over the body.  It can lead to tightening of muscles in the head and neck so you can't open your mouth, swallow, or sometimes even breathe. Tetanus kills about  1 out of 10 people who are infected even after receiving the best medical care. DIPHTHERIA is also rare in the [place] today. It can cause a thick coating to form in the back of the throat.  It can lead to breathing problems, heart failure, paralysis, and death. PERTUSSIS (Whooping Cough) causes severe coughing spells, which can cause difficulty breathing, vomiting and disturbed sleep.  It can also lead to weight loss,  incontinence, and rib fractures. Up to 2 in 100 adolescents and 5 in 100 adults with pertussis are hospitalized or have complications, which could include pneumonia or death. These diseases are caused by bacteria. Diphtheria and pertussis are spread from person to person through secretions from coughing or sneezing. Tetanus enters the body through cuts, scratches, or wounds. Before vaccines, as many as 200,000 cases of diphtheria, 200,000 cases of pertussis, and hundreds of cases of tetanus, were reported in the [place] each year. Since vaccination began, reports of cases for tetanus and diphtheria have dropped by about 99% and for pertussis by about 80%. 2. Tdap vaccine Tdap vaccine can protect adolescents and adults from tetanus, diphtheria, and pertussis. One dose of Tdap is routinely given at age 43 or 8. People who did not get Tdap at that age should get it as soon as possible. Tdap is especially important for healthcare professionals and anyone having close contact with a baby younger than 12 months. Pregnant women should get a dose of Tdap during every pregnancy, to protect the newborn from pertussis. Infants are most at risk for severe, life-threatening complications from pertussis. Another vaccine, called Td, protects against tetanus and diphtheria, but not pertussis. A Td booster should be given every 10 years. Tdap may be given as one of these boosters if you have never gotten Tdap before. Tdap may also be given after a severe cut or burn to prevent tetanus infection. Your doctor or the person giving you the vaccine can give you more information. Tdap may safely be given at the same time as other vaccines. 3. Some people should not get this vaccine  A person who has ever had a life-threatening allergic reaction after a previous dose of any diphtheria, tetanus or pertussis containing vaccine, OR has a severe allergy to any part of this vaccine, should not get Tdap vaccine. Tell the  person giving the vaccine about any severe allergies.  Anyone who had coma or long repeated seizures within 7 days after a childhood dose of DTP or DTaP, or a previous dose of Tdap, should not get Tdap, unless a cause other than the vaccine was found. They can still get Td.  Talk to your doctor if you: ? have seizures or another nervous system problem, ? had severe pain or swelling after any vaccine containing diphtheria, tetanus or pertussis, ? ever had a condition called Guillain-Barr Syndrome (GBS), ? aren't feeling well on the day the shot is scheduled. 4. Risks With any medicine, including vaccines, there is a chance of side effects. These are usually mild and go away on their own. Serious reactions are also possible but are rare. Most people who get Tdap vaccine do not have any problems with it. Mild problems following Tdap (Did not interfere with activities)  Pain where the shot was given (about 3 in 4 adolescents or 2 in 3 adults)  Redness or swelling where the shot was given (about 1 person in 5)  Mild fever of at least 100.34F (up to about 1 in 25 adolescents or  1 in 100 adults)  Headache (about 3 or 4 people in 10)  Tiredness (about 1 person in 3 or 4)  Nausea, vomiting, diarrhea, stomach ache (up to 1 in 4 adolescents or 1 in 10 adults)  Chills, sore joints (about 1 person in 10)  Body aches (about 1 person in 3 or 4)  Rash, swollen glands (uncommon) Moderate problems following Tdap (Interfered with activities, but did not require medical attention)  Pain where the shot was given (up to 1 in 5 or 6)  Redness or swelling where the shot was given (up to about 1 in 16 adolescents or 1 in 12 adults)  Fever over 102F (about 1 in 100 adolescents or 1 in 250 adults)  Headache (about 1 in 7 adolescents or 1 in 10 adults)  Nausea, vomiting, diarrhea, stomach ache (up to 1 or 3 people in 100)  Swelling of the entire arm where the shot was given (up to about 1 in  500). Severe problems following Tdap (Unable to perform usual activities; required medical attention)  Swelling, severe pain, bleeding and redness in the arm where the shot was given (rare). Problems that could happen after any vaccine:  People sometimes faint after a medical procedure, including vaccination. Sitting or lying down for about 15 minutes can help prevent fainting, and injuries caused by a fall. Tell your doctor if you feel dizzy, or have vision changes or ringing in the ears.  Some people get severe pain in the shoulder and have difficulty moving the arm where a shot was given. This happens very rarely.  Any medication can cause a severe allergic reaction. Such reactions from a vaccine are very rare, estimated at fewer than 1 in a million doses, and would happen within a few minutes to a few hours after the vaccination. As with any medicine, there is a very remote chance of a vaccine causing a serious injury or death. The safety of vaccines is always being monitored. For more information, visit: http://www.aguilar.org/ 5. What if there is a serious problem? What should I look for?  Look for anything that concerns you, such as signs of a severe allergic reaction, very high fever, or unusual behavior. Signs of a severe allergic reaction can include hives, swelling of the face and throat, difficulty breathing, a fast heartbeat, dizziness, and weakness. These would usually start a few minutes to a few hours after the vaccination. What should I do?  If you think it is a severe allergic reaction or other emergency that can't wait, call 9-1-1 or get the person to the nearest hospital. Otherwise, call your doctor.  Afterward, the reaction should be reported to the Vaccine Adverse Event Reporting System (VAERS). Your doctor might file this report, or you can do it yourself through the VAERS web site at www.vaers.SamedayNews.es, or by calling 603-430-4842. VAERS does not give medical  advice. 6. The National Vaccine Injury Compensation Program The [employer] Vaccine Injury Compensation Program (VICP) is a federal program that was created to compensate people who may have been injured by certain vaccines. Persons who believe they may have been injured by a vaccine can learn about the program and about filing a claim by calling 416-283-0717 or visiting the [place] website at GoldCloset.com.ee. There is a time limit to file a claim for compensation. 7. How can I learn more?  Ask your doctor. He or she can give you the vaccine package insert or suggest other sources of information.  Call your local or state  health department.  Contact the Centers for Disease Control and Prevention (CDC): ? Call 364-215-2765 (1-800-CDC-INFO) or ? Visit CDC's website at http://hunter.com/ Vaccine Information Statement Tdap Vaccine (06/28/2013) This information is not intended to replace advice given to you by your health care provider. Make sure you discuss any questions you have with your health care provider. Document Released: 10/21/2011 Document Revised: 12/07/2017 Document Reviewed: 12/07/2017 Elsevier Interactive Patient Education  2019 [employer].

## 2018-06-14 NOTE — Assessment & Plan Note (Signed)
Will start her on chantix. Recheck 1 month. Call with any concerns.

## 2018-06-14 NOTE — Telephone Encounter (Signed)
LVM for patient to contact office regarding rescheduling colonoscopy that was originally scheduled for 06/18/18.  This was my second attempt to notify and reschedule.  My first attempt was 06/02/18 LVM at that time as well.  Thanks [employer]

## 2018-06-14 NOTE — Progress Notes (Signed)
BP 125/85   Pulse 97   Temp 98.1 F (36.7 C) (Oral)   Ht 5\' 5"  (1.651 m)   Wt 199 lb 14.4 oz (90.7 kg)   SpO2 97%   BMI 33.27 kg/m    Subjective:    Patient ID: Crystal Carson, female    DOB: November 23, 1965, 53 y.o.   MRN: [id number]  HPI: Crystal Carson is a 53 y.o. female presenting on 06/14/2018 for comprehensive medical examination. Current medical complaints include: Had a cold about a week ago, feeling better, but still having a couple of things holding.   SMOKING CESSATION Smoking Status: daily smoker Smoking Amount: 2ppd, smoking about 1ppd  Smoking Onset: 40 years Smoking Quit Date: not set Smoking triggers: eating and stress Type of tobacco use: cigarettes Other household members who smoke: yes Pneumovax: No  Menopausal Symptoms: yes  Depression Screen done today and results listed below:  Depression screen [facility] 2/9 05/19/2018  Decreased Interest 2  Down, Depressed, Hopeless 0  PHQ - 2 Score 2  Altered sleeping 3  Tired, decreased energy 3  Change in appetite 2  Feeling bad or failure about yourself  0  Trouble concentrating 0  Moving slowly or fidgety/restless 0  Suicidal thoughts 0  PHQ-9 Score 10  Difficult doing work/chores Somewhat difficult    Past Medical History:  Past Medical History:  Diagnosis Date  . Thyroid disease     Surgical History:  Past Surgical History:  Procedure Laterality Date  . ABDOMINAL HYSTERECTOMY    . APPENDECTOMY    . CESAREAN SECTION    . CHOLECYSTECTOMY    . NECK SURGERY    . SPINE SURGERY    . THYROIDECTOMY    . TUBAL LIGATION    . VISCERAL ANGIOGRAPHY N/A 05/13/2018   Procedure: VISCERAL ANGIOGRAPHY;  Surgeon: Algernon Huxley, MD;  Location: [facility];  Service: Cardiovascular;  Laterality: N/A;    Medications:  Current Outpatient Medications on File Prior to Visit  Medication Sig  . Vitamin D, Ergocalciferol, (DRISDOL) 1.25 MG (50000 UT) CAPS capsule Take 1 capsule (50,000 Units total) by mouth every 7  (seven) days.   No current facility-administered medications on file prior to visit.     Allergies:  No Known Allergies  Social History:  Social History   Socioeconomic History  . Marital status: Married    Spouse name: Not on file  . Number of children: Not on file  . Years of education: Not on file  . Highest education level: Not on file  Occupational History  . Not on file  Social Needs  . Financial resource strain: Not on file  . Food insecurity:    Worry: Not on file    Inability: Not on file  . Transportation needs:    Medical: Not on file    Non-medical: Not on file  Tobacco Use  . Smoking status: Current Every Day Smoker    Packs/day: 1.00    Years: 40.00    Pack years: 40.00    Types: Cigarettes  . Smokeless tobacco: Never Used  Substance and Sexual Activity  . Alcohol use: Yes  . Drug use: Never  . Sexual activity: Not on file  Lifestyle  . Physical activity:    Days per week: Not on file    Minutes per session: Not on file  . Stress: Not on file  Relationships  . Social connections:    Talks on phone: Not on file    Gets  together: Not on file    Attends religious service: Not on file    Active member of club or organization: Not on file    Attends meetings of clubs or organizations: Not on file    Relationship status: Not on file  . Intimate partner violence:    Fear of current or ex partner: Not on file    Emotionally abused: Not on file    Physically abused: Not on file    Forced sexual activity: Not on file  Other Topics Concern  . Not on file  Social History Narrative  . Not on file   Social History   Tobacco Use  Smoking Status Current Every Day Smoker  . Packs/day: 1.00  . Years: 40.00  . Pack years: 40.00  . Types: Cigarettes  Smokeless Tobacco Never Used   Social History   Substance and Sexual Activity  Alcohol Use Yes    Family History:  Family History  Problem Relation Age of Onset  . Hypertension Mother   . Stroke  Mother 41  . Heart attack Paternal Grandfather   . Dementia Maternal Aunt   . Dementia Maternal Grandmother     Past medical history, surgical history, medications, allergies, family history and social history reviewed with patient today and changes made to appropriate areas of the chart.   Review of Systems  Constitutional: Positive for diaphoresis. Negative for chills, fever, malaise/fatigue and weight loss.  HENT: Positive for congestion. Negative for ear discharge, ear pain, hearing loss, nosebleeds, sinus pain, sore throat and tinnitus.   Eyes: Negative.   Respiratory: Positive for cough and wheezing. Negative for hemoptysis, sputum production, shortness of breath and stridor.   Cardiovascular: Negative.   Gastrointestinal: Positive for constipation and diarrhea. Negative for abdominal pain, blood in stool, heartburn, melena, nausea and vomiting.  Genitourinary: Negative.   Musculoskeletal: Positive for joint pain. Negative for back pain, falls, myalgias and neck pain.  Skin: Positive for rash (has eczema, usually well controlled with triamcinalone). Negative for itching.  Neurological: Negative.   Endo/Heme/Allergies: Positive for environmental allergies. Negative for polydipsia. Does not bruise/bleed easily.  Psychiatric/Behavioral: Negative.     All other ROS negative except what is listed above and in the HPI.      Objective:    BP 125/85   Pulse 97   Temp 98.1 F (36.7 C) (Oral)   Ht 5\' 5"  (1.651 m)   Wt 199 lb 14.4 oz (90.7 kg)   SpO2 97%   BMI 33.27 kg/m   Wt Readings from Last 3 Encounters:  06/14/18 199 lb 14.4 oz (90.7 kg)  06/11/18 196 lb (88.9 kg)  05/18/18 149 lb 14.4 oz (68 kg)    Physical Exam Vitals signs and nursing note reviewed.  Constitutional:      General: She is not in acute distress.    Appearance: Normal appearance. She is not ill-appearing, toxic-appearing or diaphoretic.  HENT:     Head: Normocephalic and atraumatic.     Right Ear:  Tympanic membrane, ear canal and external ear normal. There is no impacted cerumen.     Left Ear: Tympanic membrane, ear canal and external ear normal. There is no impacted cerumen.     Nose: Nose normal. No congestion or rhinorrhea.     Mouth/Throat:     Mouth: Mucous membranes are moist.     Pharynx: Oropharynx is clear. No oropharyngeal exudate or posterior oropharyngeal erythema.  Eyes:     General: No scleral icterus.  Right eye: No discharge.        Left eye: No discharge.     Extraocular Movements: Extraocular movements intact.     Conjunctiva/sclera: Conjunctivae normal.     Pupils: Pupils are equal, round, and reactive to light.  Neck:     Musculoskeletal: Normal range of motion and neck supple. No neck rigidity or muscular tenderness.     Vascular: No carotid bruit.  Cardiovascular:     Rate and Rhythm: Normal rate and regular rhythm.     Pulses: Normal pulses.     Heart sounds: No murmur. No friction rub. No gallop.   Pulmonary:     Effort: Pulmonary effort is normal. No respiratory distress.     Breath sounds: Normal breath sounds. No stridor. No wheezing, rhonchi or rales.  Chest:     Chest wall: No tenderness.  Abdominal:     General: Abdomen is flat. Bowel sounds are normal. There is no distension.     Palpations: Abdomen is soft. There is no mass.     Tenderness: There is no abdominal tenderness. There is no right CVA tenderness, left CVA tenderness, guarding or rebound.     Hernia: No hernia is present.  Genitourinary:    Comments: Breast and pelvic exams deferred with shared decision making Musculoskeletal:        General: No swelling, tenderness, deformity or signs of injury.     Right lower leg: No edema.     Left lower leg: No edema.  Lymphadenopathy:     Cervical: No cervical adenopathy.  Skin:    General: Skin is warm and dry.     Capillary Refill: Capillary refill takes less than 2 seconds.     Coloration: Skin is not jaundiced or pale.      Findings: No bruising, erythema, lesion or rash.  Neurological:     General: No focal deficit present.     Mental Status: She is alert and oriented to person, place, and time. Mental status is at baseline.     Cranial Nerves: No cranial nerve deficit.     Sensory: No sensory deficit.     Motor: No weakness.     Coordination: Coordination normal.     Gait: Gait normal.     Deep Tendon Reflexes: Reflexes normal.  Psychiatric:        Mood and Affect: Mood normal.        Behavior: Behavior normal.        Thought Content: Thought content normal.        Judgment: Judgment normal.     Results for orders placed or performed in visit on 05/18/18  CBC with Differential/Platelet  Result Value Ref Range   WBC 10.6 3.4 - 10.8 x10E3/uL   RBC 4.96 3.77 - 5.28 x10E6/uL   Hemoglobin 14.8 11.1 - 15.9 g/dL   Hematocrit 43.2 34.0 - 46.6 %   MCV 87 79 - 97 fL   MCH 29.8 26.6 - 33.0 pg   MCHC 34.3 31.5 - 35.7 g/dL   RDW 12.1 11.7 - 15.4 %   Platelets 303 150 - 450 x10E3/uL   Neutrophils 70 Not Estab. %   Lymphs 22 Not Estab. %   Monocytes 6 Not Estab. %   Eos 2 Not Estab. %   Basos 0 Not Estab. %   Neutrophils Absolute 7.4 (H) 1.4 - 7.0 x10E3/uL   Lymphocytes Absolute 2.4 0.7 - 3.1 x10E3/uL   Monocytes Absolute 0.7 0.1 - 0.9 x10E3/uL  EOS (ABSOLUTE) 0.2 0.0 - 0.4 x10E3/uL   Basophils Absolute 0.0 0.0 - 0.2 x10E3/uL   Immature Granulocytes 0 Not Estab. %   Immature Grans (Abs) 0.0 0.0 - 0.1 x10E3/uL  Bayer DCA Hb A1c Waived  Result Value Ref Range   HB A1C (BAYER DCA - WAIVED) 5.1 <7.0 %  Comprehensive metabolic panel  Result Value Ref Range   Glucose 91 65 - 99 mg/dL   BUN 8 6 - 24 mg/dL   Creatinine, Ser 0.61 0.57 - 1.00 mg/dL   GFR calc non Af Amer 105 >59 mL/min/1.73   GFR calc Af Amer 121 >59 mL/min/1.73   BUN/Creatinine Ratio 13 9 - 23   Sodium 140 134 - 144 mmol/L   Potassium 4.0 3.5 - 5.2 mmol/L   Chloride 99 96 - 106 mmol/L   CO2 21 20 - 29 mmol/L   Calcium 9.8 8.7 - 10.2  mg/dL   Total Protein 7.6 6.0 - 8.5 g/dL   Albumin 4.7 3.5 - 5.5 g/dL   Globulin, Total 2.9 1.5 - 4.5 g/dL   Albumin/Globulin Ratio 1.6 1.2 - 2.2   Bilirubin Total 0.4 0.0 - 1.2 mg/dL   Alkaline Phosphatase 96 39 - 117 IU/L   AST 16 0 - 40 IU/L   ALT 24 0 - 32 IU/L  Lipid Panel w/o Chol/HDL Ratio  Result Value Ref Range   Cholesterol, Total 204 (H) 100 - 199 mg/dL   Triglycerides 96 0 - 149 mg/dL   HDL 46 >39 mg/dL   VLDL Cholesterol Cal 19 5 - 40 mg/dL   LDL Calculated 139 (H) 0 - 99 mg/dL  TSH  Result Value Ref Range   TSH 1.040 0.450 - 4.500 uIU/mL  UA/M w/rflx Culture, Routine  Result Value Ref Range   Specific Gravity, UA <1.005 (L) 1.005 - 1.030   pH, UA 6.0 5.0 - 7.5   Color, UA Yellow Yellow   Appearance Ur Clear Clear   Leukocytes, UA Negative Negative   Protein, UA Negative Negative/Trace   Glucose, UA Negative Negative   Ketones, UA Negative Negative   RBC, UA Negative Negative   Bilirubin, UA Negative Negative   Urobilinogen, Ur 0.2 0.2 - 1.0 mg/dL   Nitrite, UA Negative Negative  VITAMIN D 25 Hydroxy (Vit-D Deficiency, Fractures)  Result Value Ref Range   Vit D, 25-Hydroxy 17.7 (L) 30.0 - 100.0 ng/mL  B12 and Folate Panel  Result Value Ref Range   Vitamin B-12 434 232 - 1,245 pg/mL   Folate 10.1 >3.0 ng/mL  HIV Antibody (routine testing w rflx)  Result Value Ref Range   HIV Screen 4th Generation wRfx Non Reactive Non Reactive      Assessment & Plan:   Problem List Items Addressed This Visit      Other   Tobacco abuse    Will start her on chantix. Recheck 1 month. Call with any concerns.        Other Visit Diagnoses    Routine general medical examination at a health care facility    -  Primary   Vaccines updated/declined. Screening labs checked last visit. Colonoscopy and mammogram ordered. Pap N/A. Continue diet and exercise. Call with any concerns.    Hot flashes       Will try herbals. Call with any concerns. If not getting better, let us  know.    Arthralgia, unspecified joint       About 11 months ago- had a rash for a while, Will  check labs and await results.    Relevant Orders   Lyme Ab/Western Blot Reflex   Rocky mtn spotted fvr abs pnl(IgG+IgM)   Babesia microti Antibody Panel   Ehrlichia Antibody Panel   Tick bite, initial encounter       About 11 months ago- had a rash for a while, Will check labs and await results.    Relevant Orders   Lyme Ab/Western Blot Reflex   Rocky mtn spotted fvr abs pnl(IgG+IgM)   Babesia microti Antibody Panel   Ehrlichia Antibody Panel   Need for Tdap vaccination       Tdap given today.   Relevant Orders   Tdap vaccine greater than or equal to 7yo IM       Follow up plan: Return in about 4 weeks (around 07/12/2018) for follow up smoking.   LABORATORY TESTING:  - Pap smear: not applicable  IMMUNIZATIONS:   - Tdap: Tetanus vaccination status reviewed: Given today - Influenza: Refused - Pneumovax: Refused  SCREENING: -Mammogram: Ordered today  - Colonoscopy: Ordered today   PATIENT COUNSELING:   Advised to take 1 mg of folate supplement per day if capable of pregnancy.   Sexuality: Discussed sexually transmitted diseases, partner selection, use of condoms, avoidance of unintended pregnancy  and contraceptive alternatives.   Advised to avoid cigarette smoking.  I discussed with the patient that most people either abstain from alcohol or drink within safe limits (<=14/week and <=4 drinks/occasion for males, <=7/weeks and <= 3 drinks/occasion for females) and that the risk for alcohol disorders and other health effects rises proportionally with the number of drinks per week and how often a drinker exceeds daily limits.  Discussed cessation/primary prevention of drug use and availability of treatment for abuse.   Diet: Encouraged to adjust caloric intake to maintain  or achieve ideal body weight, to reduce intake of dietary saturated fat and total fat, to limit sodium intake  by avoiding high sodium foods and not adding table salt, and to maintain adequate dietary potassium and calcium preferably from fresh fruits, vegetables, and low-fat dairy products.    stressed the importance of regular exercise  Injury prevention: Discussed safety belts, safety helmets, smoke detector, smoking near bedding or upholstery.   Dental health: Discussed importance of regular tooth brushing, flossing, and dental visits.    NEXT PREVENTATIVE PHYSICAL DUE IN 1 YEAR. Return in about 4 weeks (around 07/12/2018) for follow up smoking.

## 2018-06-16 LAB — LYME, WESTERN BLOT, SERUM (REFLEXED)
IGG P28 AB.: ABSENT
IGM P39 AB.: ABSENT
IgG P18 Ab.: ABSENT
IgG P23 Ab.: ABSENT
IgG P30 Ab.: ABSENT
IgG P39 Ab.: ABSENT
IgG P41 Ab.: ABSENT
IgG P45 Ab.: ABSENT
IgG P58 Ab.: ABSENT
IgG P66 Ab.: ABSENT
IgG P93 Ab.: ABSENT
IgM P23 Ab.: ABSENT
IgM P41 Ab.: ABSENT
Lyme IgG Wb: NEGATIVE
Lyme IgM Wb: NEGATIVE

## 2018-06-16 LAB — BABESIA MICROTI ANTIBODY PANEL
Babesia microti IgG: <1:10 {titer}
Babesia microti IgM: <1:10 {titer}

## 2018-06-16 LAB — EHRLICHIA ANTIBODY PANEL
E. Chaffeensis (HME) IgM Titer: NEGATIVE
E.Chaffeensis (HME) IgG: NEGATIVE
HGE IgG Titer: NEGATIVE
HGE IgM Titer: NEGATIVE

## 2018-06-16 LAB — LYME AB/WESTERN BLOT REFLEX
LYME DISEASE AB, QUANT, IGM: 2.79 index — ABNORMAL HIGH (ref 0.00–0.79)
Lyme IgG/IgM Ab: <0.91 {ISR} (ref 0.00–0.90)

## 2018-06-16 LAB — ROCKY MTN SPOTTED FVR ABS PNL(IGG+IGM)
RMSF IGG: NEGATIVE
RMSF IgM: 1.19 index — ABNORMAL HIGH (ref 0.00–0.89)

## 2018-06-17 ENCOUNTER — Ambulatory Visit
Admission: RE | Admit: 2018-06-17 | Discharge: 2018-06-17 | Disposition: A | Discharge status: Home / Self Care | Payer: BLUE CROSS/BLUE SHIELD | Source: Ambulatory Visit | Attending: Family Medicine | Admitting: Family Medicine

## 2018-06-17 ENCOUNTER — Ambulatory Visit
Admission: RE | Admit: 2018-06-17 | Discharge: 2018-06-17 | Disposition: A | Discharge status: Home / Self Care | Payer: BLUE CROSS/BLUE SHIELD | Attending: Family Medicine | Admitting: Family Medicine

## 2018-06-17 ENCOUNTER — Other Ambulatory Visit: Payer: Self-pay | Admitting: Family Medicine

## 2018-06-17 DIAGNOSIS — M4722 Other spondylosis with radiculopathy, cervical region: Secondary | ICD-10-CM | POA: Insufficient documentation

## 2018-06-17 DIAGNOSIS — M79644 Pain in right finger(s): Secondary | ICD-10-CM | POA: Diagnosis not present

## 2018-06-17 DIAGNOSIS — G8929 Other chronic pain: Secondary | ICD-10-CM

## 2018-06-17 DIAGNOSIS — M25511 Pain in right shoulder: Secondary | ICD-10-CM

## 2018-06-17 DIAGNOSIS — M47812 Spondylosis without myelopathy or radiculopathy, cervical region: Secondary | ICD-10-CM | POA: Diagnosis not present

## 2018-06-17 DIAGNOSIS — M19011 Primary osteoarthritis, right shoulder: Secondary | ICD-10-CM | POA: Diagnosis not present

## 2018-06-17 DIAGNOSIS — M4802 Spinal stenosis, cervical region: Secondary | ICD-10-CM | POA: Diagnosis not present

## 2018-06-17 MED ORDER — DOXYCYCLINE HYCLATE 100 MG PO TABS: 100.0000 mg | ORAL_TABLET | Freq: Two times a day (BID) | ORAL | 0 refills | Status: DC

## 2018-06-18 ENCOUNTER — Ambulatory Visit: Admit: 2018-06-18 | Payer: BLUE CROSS/BLUE SHIELD | Admitting: Gastroenterology

## 2018-06-18 SURGERY — COLONOSCOPY WITH PROPOFOL
Anesthesia: Choice

## 2018-06-22 ENCOUNTER — Encounter: Payer: Self-pay | Admitting: *Deleted

## 2018-06-22 ENCOUNTER — Other Ambulatory Visit: Payer: Self-pay

## 2018-06-25 ENCOUNTER — Telehealth: Payer: Self-pay

## 2018-06-25 NOTE — Telephone Encounter (Signed)
-----   Message from Algernon Huxley, MD sent at 06/25/2018  1:06 PM EST ----- Yes that will be fine.  She has had embolization and this should be very little risk at this point.   ----- Message ----- From: Glennie Isle, CMA Sent: 06/25/2018  10:48 AM EST To: Algernon Huxley, MD  Good morning Dr. Lucky Cowboy,  This pt is scheduled for a colonoscopy at the [facility] on 07/02/18. The Anesthesiologist is requesting an ok for her to be sedated in an outpatient surgical setting. 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   You an reply back to this message and I will forward to the [facility].   Thank you!  Jillien Yakel, CMA

## 2018-06-29 NOTE — Discharge Instructions (Signed)
General Anesthesia, Adult, Care After  This sheet gives you information about how to care for yourself after your procedure. Your health care provider may also give you more specific instructions. If you have problems or questions, contact your health care provider.  What can I expect after the procedure?  After the procedure, the following side effects are common:  Pain or discomfort at the IV site.  Nausea.  Vomiting.  Sore throat.  Trouble concentrating.  Feeling cold or chills.  Weak or tired.  Sleepiness and fatigue.  Soreness and body aches. These side effects can affect parts of the body that were not involved in surgery.  Follow these instructions at home:    For at least 24 hours after the procedure:  Have a responsible adult stay with you. It is important to have someone help care for you until you are awake and alert.  Rest as needed.  Do not:  Participate in activities in which you could fall or become injured.  Drive.  Use heavy machinery.  Drink alcohol.  Take sleeping pills or medicines that cause drowsiness.  Make important decisions or sign legal documents.  Take care of children on your own.  Eating and drinking  Follow any instructions from your health care provider about eating or drinking restrictions.  When you feel hungry, start by eating small amounts of foods that are soft and easy to digest (bland), such as toast. Gradually return to your regular diet.  Drink enough fluid to keep your urine pale yellow.  If you vomit, rehydrate by drinking water, juice, or clear broth.  General instructions  If you have sleep apnea, surgery and certain medicines can increase your risk for breathing problems. Follow instructions from your health care provider about wearing your sleep device:  Anytime you are sleeping, including during daytime naps.  While taking prescription pain medicines, sleeping medicines, or medicines that make you drowsy.  Return to your normal activities as told by your health care  provider. Ask your health care provider what activities are safe for you.  Take over-the-counter and prescription medicines only as told by your health care provider.  If you smoke, do not smoke without supervision.  Keep all follow-up visits as told by your health care provider. This is important.  Contact a health care provider if:  You have nausea or vomiting that does not get better with medicine.  You cannot eat or drink without vomiting.  You have pain that does not get better with medicine.  You are unable to pass urine.  You develop a skin rash.  You have a fever.  You have redness around your IV site that gets worse.  Get help right away if:  You have difficulty breathing.  You have chest pain.  You have blood in your urine or stool, or you vomit blood.  Summary  After the procedure, it is common to have a sore throat or nausea. It is also common to feel tired.  Have a responsible adult stay with you for the first 24 hours after general anesthesia. It is important to have someone help care for you until you are awake and alert.  When you feel hungry, start by eating small amounts of foods that are soft and easy to digest (bland), such as toast. Gradually return to your regular diet.  Drink enough fluid to keep your urine pale yellow.  Return to your normal activities as told by your health care provider. Ask your health care   provider what activities are safe for you.  This information is not intended to replace advice given to you by your health care provider. Make sure you discuss any questions you have with your health care provider.  Document Released: 07/28/2000 Document Revised: 12/05/2016 Document Reviewed: 12/05/2016  Elsevier Interactive Patient Education  2019 [employer]

## 2018-07-02 ENCOUNTER — Ambulatory Visit
Admission: RE | Admit: 2018-07-02 | Discharge: 2018-07-02 | Disposition: A | Discharge status: Home / Self Care | Payer: BLUE CROSS/BLUE SHIELD | Attending: Gastroenterology | Admitting: Gastroenterology

## 2018-07-02 ENCOUNTER — Ambulatory Visit: Payer: BLUE CROSS/BLUE SHIELD | Admitting: Anesthesiology

## 2018-07-02 ENCOUNTER — Encounter
Admission: RE | Disposition: A | Discharge status: Home / Self Care | Payer: Self-pay | Source: Home / Self Care | Attending: Gastroenterology

## 2018-07-02 DIAGNOSIS — Z9049 Acquired absence of other specified parts of digestive tract: Secondary | ICD-10-CM | POA: Diagnosis not present

## 2018-07-02 DIAGNOSIS — D123 Benign neoplasm of transverse colon: Secondary | ICD-10-CM | POA: Diagnosis not present

## 2018-07-02 DIAGNOSIS — D122 Benign neoplasm of ascending colon: Secondary | ICD-10-CM | POA: Diagnosis not present

## 2018-07-02 DIAGNOSIS — Z8249 Family history of ischemic heart disease and other diseases of the circulatory system: Secondary | ICD-10-CM | POA: Diagnosis not present

## 2018-07-02 DIAGNOSIS — D124 Benign neoplasm of descending colon: Secondary | ICD-10-CM

## 2018-07-02 DIAGNOSIS — Z1211 Encounter for screening for malignant neoplasm of colon: Secondary | ICD-10-CM | POA: Insufficient documentation

## 2018-07-02 DIAGNOSIS — K640 First degree hemorrhoids: Secondary | ICD-10-CM | POA: Diagnosis not present

## 2018-07-02 DIAGNOSIS — Z860100 Personal history of colon polyps, unspecified: Secondary | ICD-10-CM

## 2018-07-02 DIAGNOSIS — M1990 Unspecified osteoarthritis, unspecified site: Secondary | ICD-10-CM | POA: Insufficient documentation

## 2018-07-02 DIAGNOSIS — Z820 Family history of epilepsy and other diseases of the nervous system: Secondary | ICD-10-CM | POA: Insufficient documentation

## 2018-07-02 DIAGNOSIS — Z79899 Other long term (current) drug therapy: Secondary | ICD-10-CM | POA: Diagnosis not present

## 2018-07-02 DIAGNOSIS — Z90710 Acquired absence of both cervix and uterus: Secondary | ICD-10-CM | POA: Diagnosis not present

## 2018-07-02 DIAGNOSIS — F17210 Nicotine dependence, cigarettes, uncomplicated: Secondary | ICD-10-CM | POA: Diagnosis not present

## 2018-07-02 DIAGNOSIS — E890 Postprocedural hypothyroidism: Secondary | ICD-10-CM | POA: Diagnosis not present

## 2018-07-02 DIAGNOSIS — F17200 Nicotine dependence, unspecified, uncomplicated: Secondary | ICD-10-CM | POA: Diagnosis not present

## 2018-07-02 DIAGNOSIS — Z823 Family history of stroke: Secondary | ICD-10-CM | POA: Insufficient documentation

## 2018-07-02 HISTORY — PX: COLONOSCOPY WITH PROPOFOL: SHX5780

## 2018-07-02 HISTORY — PX: POLYPECTOMY: SHX5525

## 2018-07-02 HISTORY — DX: Presence of dental prosthetic device (complete) (partial): Z97.2

## 2018-07-02 HISTORY — DX: Aneurysm of other specified arteries: I72.8

## 2018-07-02 SURGERY — COLONOSCOPY WITH PROPOFOL
Anesthesia: General | Site: Rectum

## 2018-07-02 MED ORDER — LIDOCAINE HCL (CARDIAC) PF 100 MG/5ML IV SOSY
PREFILLED_SYRINGE | INTRAVENOUS | Status: DC | PRN
  Administered 2018-07-02: 40 mg via INTRAVENOUS

## 2018-07-02 MED ORDER — LACTATED RINGERS IV SOLN
INTRAVENOUS | Status: DC
  Administered 2018-07-02: 10:00:00 via INTRAVENOUS

## 2018-07-02 MED ORDER — SODIUM CHLORIDE 0.9 % IV SOLN: INTRAVENOUS | Status: DC

## 2018-07-02 MED ORDER — STERILE WATER FOR IRRIGATION IR SOLN
Status: DC | PRN
  Administered 2018-07-02: 10:00:00

## 2018-07-02 MED ORDER — PHENYLEPHRINE HCL 10 MG/ML IJ SOLN
INTRAMUSCULAR | Status: DC | PRN
  Administered 2018-07-02 (×2): 100 ug via INTRAVENOUS

## 2018-07-02 MED ORDER — EPHEDRINE SULFATE 50 MG/ML IJ SOLN
INTRAMUSCULAR | Status: DC | PRN
  Administered 2018-07-02: 5 mg via INTRAVENOUS

## 2018-07-02 MED ORDER — PROPOFOL 10 MG/ML IV BOLUS
INTRAVENOUS | Status: DC | PRN
  Administered 2018-07-02: 30 mg via INTRAVENOUS
  Administered 2018-07-02: 100 mg via INTRAVENOUS
  Administered 2018-07-02: 50 mg via INTRAVENOUS
  Administered 2018-07-02 (×2): 30 mg via INTRAVENOUS
  Administered 2018-07-02: 40 mg via INTRAVENOUS
  Administered 2018-07-02: 20 mg via INTRAVENOUS

## 2018-07-02 SURGICAL SUPPLY — 7 items
CANISTER SUCT 1200ML W/VALVE (MISCELLANEOUS) ×3 IMPLANT
GOWN CVR UNV OPN BCK APRN NK (MISCELLANEOUS) ×2 IMPLANT
GOWN ISOL THUMB LOOP REG UNIV (MISCELLANEOUS) ×4
KIT ENDO PROCEDURE OLY (KITS) ×3 IMPLANT
SNARE LASSO HEX 3 IN 1 (INSTRUMENTS) ×3 IMPLANT
TRAP ETRAP POLY (MISCELLANEOUS) ×3 IMPLANT
WATER STERILE IRR 250ML POUR (IV SOLUTION) ×3 IMPLANT

## 2018-07-02 NOTE — H&P (Signed)
Crystal Lame, Crystal Carson [address] [place], [place] [address] [phone] Fax : [phone]  Primary Care Physician:  Crystal Roys, Crystal Carson Primary Gastroenterologist:  Crystal Carson  Pre-Procedure History & Physical: HPI:  Crystal Carson is a 53 y.o. female is here for an colonoscopy.   Past Medical History:  Diagnosis Date  . 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 Thyroid disease   . Wears dentures    full upper    Past Surgical History:  Procedure Laterality Date  . ABDOMINAL HYSTERECTOMY    . APPENDECTOMY    . CESAREAN SECTION    . CHOLECYSTECTOMY    . NECK SURGERY    . SPINE SURGERY    . THYROIDECTOMY    . TUBAL LIGATION    . VISCERAL ANGIOGRAPHY N/A 05/13/2018   Procedure: VISCERAL ANGIOGRAPHY;  Surgeon: Crystal Huxley, Crystal Carson;  Location: [facility];  Service: Cardiovascular;  Laterality: N/A;    Prior to Admission medications   Medication Sig Start Date End Date Taking? Authorizing Provider  doxycycline (VIBRA-TABS) 100 MG tablet Take 1 tablet (100 mg total) by mouth 2 (two) times daily. 06/17/18  Yes Crystal Carson, Crystal Carson, Crystal Carson  triamcinolone cream (KENALOG) 0.1 % Apply 1 application topically 2 (two) times daily. 06/14/18  Yes Crystal Carson, Crystal Carson, Crystal Carson  varenicline (CHANTIX STARTING MONTH PAK) 0.5 MG X 11 & 1 MG X 42 tablet Take one 0.5 mg tab by mouth once daily for 3 days, then increase to one 0.5 mg tab BID for 4 days, then increase to one 1 mg tab BID 06/14/18  Yes Crystal Carson, Crystal Carson, Crystal Carson  varenicline (CHANTIX) 0.5 MG tablet Take 1 tablet (0.5 mg total) by mouth 2 (two) times daily. 06/14/18  Yes Crystal Carson, Crystal Carson, Crystal Carson  Vitamin D, Ergocalciferol, (DRISDOL) 1.25 MG (50000 UT) CAPS capsule Take 1 capsule (50,000 Units total) by mouth every 7 (seven) days. 05/20/18  Yes Crystal Carson, Crystal Carson, Crystal Carson    Allergies as of 06/14/2018  . (No Known Allergies)    Family History  Problem Relation Age of Onset  . Hypertension Mother   . Stroke Mother 25  .  Heart attack Paternal Grandfather   . Dementia Maternal Aunt   . Dementia Maternal Grandmother     Social History   Socioeconomic History  . Marital status: Married    Spouse name: Not on file  . Number of children: Not on file  . Years of education: Not on file  . Highest education level: Not on file  Occupational History  . Not on file  Social Needs  . Financial resource strain: Not on file  . Food insecurity:    Worry: Not on file    Inability: Not on file  . Transportation needs:    Medical: Not on file    Non-medical: Not on file  Tobacco Use  . Smoking status: Current Every Day Smoker    Packs/day: 1.00    Years: 40.00    Pack years: 40.00    Types: Cigarettes  . Smokeless tobacco: Never Used  . Tobacco comment: since age 23  Substance and Sexual Activity  . Alcohol use: Yes    Comment: 1-2 glass wine/year  . Drug use: Never  . Sexual activity: Not on file  Lifestyle  . Physical activity:    Days per week: Not on file    Minutes per session: Not on file  . Stress: Not on file  Relationships  . Social connections:    Talks on phone: Not on file    Gets together: Not on file    Attends religious service: Not on file    Active member of club or organization: Not on file    Attends meetings of clubs or organizations: Not on file    Relationship status: Not on file  . Intimate partner violence:    Fear of current or ex partner: Not on file    Emotionally abused: Not on file    Physically abused: Not on file    Forced sexual activity: Not on file  Other Topics Concern  . Not on file  Social History Narrative  . Not on file    Review of Systems: See HPI, otherwise negative ROS  Physical Exam: BP 121/87   Pulse 98   Temp 97.9 F (36.6 C) (Temporal)   Ht 5\' 5"  (1.651 m)   Wt 89.4 kg   SpO2 98%   BMI 32.78 kg/m  General:   Alert,  pleasant and cooperative in NAD Head:  Normocephalic and atraumatic. Neck:  Supple; no masses or thyromegaly. Lungs:   Clear throughout to auscultation.    Heart:  Regular rate and rhythm. Abdomen:  Soft, nontender and nondistended. Normal bowel sounds, without guarding, and without rebound.   Neurologic:  Alert and  oriented x4;  grossly normal neurologically.  Impression/Plan: Crystal Carson is here for an colonoscopy to be performed for history of polyps  Risks, benefits, limitations, and alternatives regarding  colonoscopy have been reviewed with the patient.  Questions have been answered.  All parties agreeable.   Crystal Lame, Crystal Carson  07/02/2018, 9:15 AM

## 2018-07-02 NOTE — Anesthesia Procedure Notes (Signed)
Procedure Name: MAC Date/Time: 07/02/2018 9:46 AM Performed by: Janna Arch, CRNA Pre-anesthesia Checklist: Patient identified, Emergency Drugs available, Suction available, Timeout performed and Patient being monitored Patient Re-evaluated:Patient Re-evaluated prior to induction Oxygen Delivery Method: Nasal cannula Placement Confirmation: positive ETCO2

## 2018-07-02 NOTE — Op Note (Signed)
[facility] Gastroenterology Patient Name: Crystal Carson Procedure Date: 07/02/2018 9:40 AM MRN: [id number] Account #: [id number] Date of Birth: 09-24-1965 Admit Type: Outpatient Age: 53 Room: [facility] OR ROOM 01 Gender: Female Note Status: Finalized Procedure:            Colonoscopy Indications:          High risk colon cancer surveillance: Personal history                        of colonic polyps Providers:            Lucilla Lame MD, MD Referring MD:         Valerie Roys (Referring MD) Medicines:            Propofol per Anesthesia Complications:        No immediate complications. Procedure:            Pre-Anesthesia Assessment:                       - Prior to the procedure, a History and Physical was                        performed, and patient medications and allergies were                        reviewed. The patient's tolerance of previous                        anesthesia was also reviewed. The risks and benefits of                        the procedure and the sedation options and risks were                        discussed with the patient. All questions were                        answered, and informed consent was obtained. Prior                        Anticoagulants: The patient has taken no previous                        anticoagulant or antiplatelet agents. ASA Grade                        Assessment: II - A patient with mild systemic disease.                        After reviewing the risks and benefits, the patient was                        deemed in satisfactory condition to undergo the                        procedure.                       After obtaining informed consent, the colonoscope was  passed under direct vision. Throughout the procedure,                        the patient's blood pressure, pulse, and oxygen                        saturations were monitored continuously. The                        Colonoscope  was introduced through the anus and                        advanced to the the cecum, identified by appendiceal                        orifice and ileocecal valve. The colonoscopy was                        performed without difficulty. The patient tolerated the                        procedure well. The quality of the bowel preparation                        was good. Findings:      The perianal and digital rectal examinations were normal.      A tattoo was seen in the cecum.      A 4 mm polyp was found in the ascending colon. The polyp was sessile.       The polyp was removed with a cold snare. Resection and retrieval were       complete.      A 4 mm polyp was found in the transverse colon. The polyp was sessile.       The polyp was removed with a cold snare. Resection and retrieval were       complete.      A 4 mm polyp was found in the descending colon. The polyp was sessile.       The polyp was removed with a cold snare. Resection and retrieval were       complete.      Non-bleeding internal hemorrhoids were found during retroflexion. The       hemorrhoids were Grade I (internal hemorrhoids that do not prolapse). Impression:           - A tattoo was seen in the cecum.                       - One 4 mm polyp in the ascending colon, removed with a                        cold snare. Resected and retrieved.                       - One 4 mm polyp in the transverse colon, removed with                        a cold snare. Resected and retrieved.                       - One 4 mm polyp  in the descending colon, removed with                        a cold snare. Resected and retrieved.                       - Non-bleeding internal hemorrhoids. Recommendation:       - Discharge patient to home.                       - Resume previous diet.                       - Continue present medications.                       - Await pathology results.                       - Repeat colonoscopy in 3 years  for surveillance. Procedure Code(s):    --- Professional ---                       [phone], Colonoscopy, flexible; with removal of tumor(s),                        polyp(s), or other lesion(s) by snare technique Diagnosis Code(s):    --- Professional ---                       Z86.010, Personal history of colonic polyps                       D12.2, Benign neoplasm of ascending colon                       D12.3, Benign neoplasm of transverse colon (hepatic                        flexure or splenic flexure)                       D12.4, Benign neoplasm of descending colon CPT copyright 2018 American Medical Association. All rights reserved. The codes documented in this report are preliminary and upon coder review may  be revised to meet current compliance requirements. Lucilla Lame MD, MD 07/02/2018 10:03:10 AM This report has been signed electronically. Number of Addenda: 0 Note Initiated On: 07/02/2018 9:40 AM Scope Withdrawal Time: 0 hours 8 minutes 19 seconds  Total Procedure Duration: 0 hours 11 minutes 42 seconds       [facility]

## 2018-07-02 NOTE — Transfer of Care (Signed)
Immediate Anesthesia Transfer of Care Note  Patient: Crystal Carson  Procedure(s) Performed: COLONOSCOPY WITH BIOPSIES (N/A Rectum) POLYPECTOMY (N/A Rectum)  Patient Location: PACU  Anesthesia Type: General  Level of Consciousness: awake, alert  and patient cooperative  Airway and Oxygen Therapy: Patient Spontanous Breathing and Patient connected to supplemental oxygen  Post-op Assessment: Post-op Vital signs reviewed, Patient's Cardiovascular Status Stable, Respiratory Function Stable, Patent Airway and No signs of Nausea or vomiting  Post-op Vital Signs: Reviewed and stable  Complications: No apparent anesthesia complications

## 2018-07-02 NOTE — Anesthesia Postprocedure Evaluation (Signed)
Anesthesia Post Note  Patient: Crystal Carson  Procedure(s) Performed: COLONOSCOPY WITH BIOPSIES (N/A Rectum) POLYPECTOMY (N/A Rectum)  Patient location during evaluation: PACU Anesthesia Type: General Level of consciousness: awake and alert Pain management: pain level controlled Vital Signs Assessment: post-procedure vital signs reviewed and stable Respiratory status: spontaneous breathing, nonlabored ventilation, respiratory function stable and patient connected to nasal cannula oxygen Cardiovascular status: blood pressure returned to baseline and stable Postop Assessment: no apparent nausea or vomiting Anesthetic complications: no    Lafayette Dunlevy C

## 2018-07-02 NOTE — Anesthesia Preprocedure Evaluation (Signed)
Anesthesia Evaluation  Patient identified by MRN, date of birth, ID band Patient awake    Reviewed: Allergy & Precautions, NPO status , Patient's Chart, lab work & pertinent test results  Airway Mallampati: II  TM Distance: >3 FB Neck ROM: Full    Dental no notable dental hx.    Pulmonary neg pulmonary ROS, Current Smoker,    Pulmonary exam normal breath sounds clear to auscultation       Cardiovascular negative cardio ROS Normal cardiovascular exam Rhythm:Regular Rate:Normal  S/p splenic artery embolization   Neuro/Psych negative neurological ROS  negative psych ROS   GI/Hepatic negative GI ROS, Neg liver ROS,   Endo/Other  negative endocrine ROS  Renal/GU negative Renal ROS  negative genitourinary   Musculoskeletal  (+) Arthritis ,   Abdominal   Peds negative pediatric ROS (+)  Hematology negative hematology ROS (+)   Anesthesia Other Findings   Reproductive/Obstetrics negative OB ROS                             Anesthesia Physical Anesthesia Plan  ASA: II  Anesthesia Plan: General   Post-op Pain Management:    Induction: Intravenous  PONV Risk Score and Plan:   Airway Management Planned: Natural Airway  Additional Equipment:   Intra-op Plan:   Post-operative Plan: Extubation in OR  Informed Consent: I have reviewed the patients History and Physical, chart, labs and discussed the procedure including the risks, benefits and alternatives for the proposed anesthesia with the patient or authorized representative who has indicated his/her understanding and acceptance.     Dental advisory given  Plan Discussed with: CRNA  Anesthesia Plan Comments:         Anesthesia Quick Evaluation

## 2018-07-05 ENCOUNTER — Encounter: Payer: Self-pay | Admitting: Gastroenterology

## 2018-07-06 ENCOUNTER — Encounter: Payer: Self-pay | Admitting: Gastroenterology

## 2018-07-13 ENCOUNTER — Ambulatory Visit: Payer: BLUE CROSS/BLUE SHIELD | Admitting: Family Medicine

## 2018-08-02 ENCOUNTER — Encounter: Payer: Self-pay | Admitting: Family Medicine

## 2018-08-02 ENCOUNTER — Ambulatory Visit (INDEPENDENT_AMBULATORY_CARE_PROVIDER_SITE_OTHER): Payer: BLUE CROSS/BLUE SHIELD | Admitting: Family Medicine

## 2018-08-02 ENCOUNTER — Other Ambulatory Visit: Payer: Self-pay

## 2018-08-02 VITALS — BP 119/72 | HR 78 | Temp 95.5°F | Wt 200.2 lb

## 2018-08-02 DIAGNOSIS — R1031 Right lower quadrant pain: Secondary | ICD-10-CM | POA: Diagnosis not present

## 2018-08-02 DIAGNOSIS — Z720 Tobacco use: Secondary | ICD-10-CM

## 2018-08-02 DIAGNOSIS — K439 Ventral hernia without obstruction or gangrene: Secondary | ICD-10-CM

## 2018-08-02 MED ORDER — VARENICLINE TARTRATE 1 MG PO TABS: 1.0000 mg | ORAL_TABLET | Freq: Two times a day (BID) | ORAL | 2 refills | Status: DC

## 2018-08-02 NOTE — Assessment & Plan Note (Signed)
Still smoking about a pack a day. Doing well on the chantix without side effects. Will continue chantix on continuing dose. Recheck 1 month. Call with any concerns.

## 2018-08-02 NOTE — Progress Notes (Signed)
BP 119/72 Comment: pt reported- virtual visit  Pulse 78 Comment: pt reported- virtual visit  Temp (!) 95.5 F (35.3 C) (Oral) Comment: pt virtual  Wt 200 lb 3.2 oz (90.8 kg) Comment: pt reported- virtual visit  BMI 33.32 kg/m    Subjective:    Patient ID: Crystal Carson, female    DOB: 1965/05/30, 53 y.o.   MRN: [id number]  HPI: Crystal Carson is a 53 y.o. female  Chief Complaint  Patient presents with  . Nicotine Dependence  . TELEMEDICINE VISIT   SMOKING CESSATION- started on chantix last month. She notes that some days are better than others. She notes that every day seems to be a bit different.  Smoking Status: daily smoker Smoking Amount: around 1ppd Smoking Onset: 40 years ago Smoking triggers: eating and stress Type of tobacco use: cigarettes Other household members who smoke: yes Treatments attempted: Chantix Pneumovax: Refused  ABDOMINAL PAIN  Duration:chronic Onset: gradual Severity: moderate Quality: cramping and sore Location:  RLQ  Episode duration: minutes to hours Radiation: no Frequency: intermittent Alleviating factors:  Aggravating factors: Status: fluctuating Treatments attempted: none Fever: no Nausea: no Vomiting: no Weight loss: no Decreased appetite: no Diarrhea: no Constipation: no Blood in stool: no Heartburn: no Jaundice: no Rash: no Dysuria/urinary frequency: no Hematuria: no History of sexually transmitted disease: no Recurrent NSAID use: no   Relevant past medical, surgical, family and social history reviewed and updated as indicated. Interim medical history since our last visit reviewed. Allergies and medications reviewed and updated.  Review of Systems  Constitutional: Negative.   Respiratory: Negative.   Cardiovascular: Negative.   Musculoskeletal: Negative.   Neurological: Negative.   Psychiatric/Behavioral: Negative.     Per HPI unless specifically indicated above     Objective:    BP 119/72 Comment: pt  reported- virtual visit  Pulse 78 Comment: pt reported- virtual visit  Temp (!) 95.5 F (35.3 C) (Oral) Comment: pt virtual  Wt 200 lb 3.2 oz (90.8 kg) Comment: pt reported- virtual visit  BMI 33.32 kg/m   Wt Readings from Last 3 Encounters:  08/02/18 200 lb 3.2 oz (90.8 kg)  07/02/18 197 lb (89.4 kg)  06/14/18 199 lb 14.4 oz (90.7 kg)    Physical Exam Vitals signs and nursing note reviewed.  Pulmonary:     Effort: Pulmonary effort is normal. No respiratory distress.     Comments: Speaking in full sentences Abdominal:     Comments: Per patient report- mild pain on palpation of RLQ, no other pain- unable to visually confirm this  Neurological:     Mental Status: She is alert.  Psychiatric:        Mood and Affect: Mood normal.        Behavior: Behavior normal.        Thought Content: Thought content normal.        Judgment: Judgment normal.     Results for orders placed or performed in visit on 06/14/18  Lyme Ab/Western Blot Reflex  Result Value Ref Range   Lyme IgG/IgM Ab <0.91 0.00 - 0.90 ISR   LYME DISEASE AB, QUANT, IGM 2.79 (H) 0.00 - 0.79 index  Rocky mtn spotted fvr abs pnl(IgG+IgM)  Result Value Ref Range   RMSF IgG Negative Negative   RMSF IgM 1.19 (H) 0.00 - 0.89 index  Babesia microti Antibody Panel  Result Value Ref Range   Babesia microti IgM <1:10 Neg:<1:10   Babesia microti IgG <3:76 EGB:<1:51  Ehrlichia Antibody Panel  Result Value  Ref Range   E.Chaffeensis (HME) IgG Negative Neg:<1:64   E. Chaffeensis (HME) IgM Titer Negative Neg:<1:20   HGE IgG Titer Negative Neg:<1:64   HGE IgM Titer Negative Neg:<1:20  Lyme, Western Blot, Serum (reflexed)  Result Value Ref Range     IgG P93 Ab. Absent      IgG P66 Ab. Absent      IgG P58 Ab. Absent      IgG P45 Ab. Absent      IgG P41 Ab. Absent      IgG P39 Ab. Absent      IgG P30 Ab. Absent      IgG P28 Ab. Absent      IgG P23 Ab. Absent      IgG P18 Ab. Absent    Lyme IgG Wb Negative      IgM P41 Ab.  Absent      IgM P39 Ab. Absent      IgM P23 Ab. Absent    Lyme IgM Wb Negative       Assessment & Plan:   Problem List Items Addressed This Visit      Other   Tobacco abuse - Primary    Still smoking about a pack a day. Doing well on the chantix without side effects. Will continue chantix on continuing dose. Recheck 1 month. Call with any concerns.        Other Visit Diagnoses    Right lower quadrant abdominal pain       Has continued after colonoscopy. She is afraid she has diverticulitis. Will check WBC- await results. Work on diverticulosis diet. Follow up with GI as needed.    Relevant Orders   CBC with Differential/Platelet   Hernia of abdominal wall       Patient states that she has 2 hernias. Would like to see general sugery when COVID is over. Referral generated today. Call with any concerns.    Relevant Orders   Ambulatory referral to General Surgery       Follow up plan: Return in about 4 weeks (around 08/30/2018) for follow up smoking.    . This visit was completed via telephone due to the restrictions of the COVID-19 pandemic. All issues as above were discussed and addressed but no physical exam was performed. If it was felt that the patient should be evaluated in the office, they were directed there. The patient verbally consented to this visit. Patient was unable to complete an audio/visual visit due to Lack of equipment. Due to the catastrophic nature of the COVID-19 pandemic, this visit was done through audio contact only. . Location of the patient: home . Location of the provider: home . Those involved with this call:  . Provider: Park Liter, DO . CMA: Yvonna Alanis, [place] . Front Desk/Registration: Don Perking  . Time spent on call: 25 minutes on the phone discussing health concerns

## 2018-08-02 NOTE — Patient Instructions (Signed)
Diverticulosis    Diverticulosis is a condition that develops when small pouches (diverticula) form in the wall of the large intestine (colon). The colon is where water is absorbed and stool is formed. The pouches form when the inside layer of the colon pushes through weak spots in the outer layers of the colon. You may have a few pouches or many of them.  What are the causes?  The cause of this condition is not known.  What increases the risk?  The following factors may make you more likely to develop this condition:   Being older than age 60. Your risk for this condition increases with age. Diverticulosis is rare among people younger than age 30. By age 80, many people have it.   Eating a low-fiber diet.   Having frequent constipation.   Being overweight.   Not getting enough exercise.   Smoking.   Taking over-the-counter pain medicines, like aspirin and ibuprofen.   Having a family history of diverticulosis.  What are the signs or symptoms?  In most people, there are no symptoms of this condition. If you do have symptoms, they may include:   Bloating.   Cramps in the abdomen.   Constipation or diarrhea.   Pain in the lower left side of the abdomen.  How is this diagnosed?  This condition is most often diagnosed during an exam for other colon problems. Because diverticulosis usually has no symptoms, it often cannot be diagnosed independently. This condition may be diagnosed by:   Using a flexible scope to examine the colon (colonoscopy).   Taking an X-ray of the colon after dye has been put into the colon (barium enema).   Doing a CT scan.  How is this treated?  You may not need treatment for this condition if you have never developed an infection related to diverticulosis. If you have had an infection before, treatment may include:   Eating a high-fiber diet. This may include eating more fruits, vegetables, and grains.   Taking a fiber supplement.   Taking a live bacteria supplement  (probiotic).   Taking medicine to relax your colon.   Taking antibiotic medicines.  Follow these instructions at home:   Drink 6-8 glasses of water or more each day to prevent constipation.   Try not to strain when you have a bowel movement.   If you have had an infection before:  ? Eat more fiber as directed by your health care provider or your diet and nutrition specialist (dietitian).  ? Take a fiber supplement or probiotic, if your health care provider approves.   Take over-the-counter and prescription medicines only as told by your health care provider.   If you were prescribed an antibiotic, take it as told by your health care provider. Do not stop taking the antibiotic even if you start to feel better.   Keep all follow-up visits as told by your health care provider. This is important.  Contact a health care provider if:   You have pain in your abdomen.   You have bloating.   You have cramps.   You have not had a bowel movement in 3 days.  Get help right away if:   Your pain gets worse.   Your bloating becomes very bad.   You have a fever or chills, and your symptoms suddenly get worse.   You vomit.   You have bowel movements that are bloody or black.   You have bleeding from your rectum.  Summary     Diverticulosis is a condition that develops when small pouches (diverticula) form in the wall of the large intestine (colon).   You may have a few pouches or many of them.   This condition is most often diagnosed during an exam for other colon problems.   If you have had an infection related to diverticulosis, treatment may include increasing the fiber in your diet, taking supplements, or taking medicines.  This information is not intended to replace advice given to you by your health care provider. Make sure you discuss any questions you have with your health care provider.  Document Released: 01/17/2004 Document Revised: 03/10/2016 Document Reviewed: 03/10/2016  Elsevier Interactive Patient  Education  2019 [employer]

## 2018-08-09 ENCOUNTER — Other Ambulatory Visit: Payer: Self-pay

## 2018-09-06 ENCOUNTER — Ambulatory Visit: Payer: Self-pay | Admitting: Family Medicine

## 2018-09-10 ENCOUNTER — Encounter (INDEPENDENT_AMBULATORY_CARE_PROVIDER_SITE_OTHER): Payer: Self-pay | Admitting: Vascular Surgery

## 2018-09-10 ENCOUNTER — Ambulatory Visit (INDEPENDENT_AMBULATORY_CARE_PROVIDER_SITE_OTHER): Payer: BLUE CROSS/BLUE SHIELD

## 2018-09-10 ENCOUNTER — Other Ambulatory Visit: Payer: Self-pay

## 2018-09-10 ENCOUNTER — Ambulatory Visit (INDEPENDENT_AMBULATORY_CARE_PROVIDER_SITE_OTHER): Payer: BLUE CROSS/BLUE SHIELD | Admitting: Vascular Surgery

## 2018-09-10 VITALS — BP 122/80 | HR 80 | Resp 18 | Ht 65.0 in | Wt 205.0 lb

## 2018-09-10 DIAGNOSIS — I728 Aneurysm of other specified arteries: Secondary | ICD-10-CM | POA: Diagnosis not present

## 2018-09-10 NOTE — Assessment & Plan Note (Signed)
Her duplex today does not show any flow in any splenic artery aneurysm.  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  No other mesenteric artery disease is identified either. 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  We will now follow her on an annual basis.

## 2018-09-10 NOTE — Progress Notes (Signed)
MRN : [id number]  Crystal Carson is a 53 y.o. (May 07, 1965) female who presents with chief complaint of  Chief Complaint  Patient presents with  . Follow-up    ultrasound follow up  .  History of Present Illness: Patient returns today in follow up of her splenic artery aneurysm.  She is doing well and does not have any complaints today.  No abdominal pain.  No fevers or chills.  Her duplex today does not show any flow in any splenic artery aneurysm.  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  No other mesenteric artery disease is identified either.  Current Outpatient Medications  Medication Sig Dispense Refill  . triamcinolone cream (KENALOG) 0.1 % Apply 1 application topically 2 (two) times daily. 30 g 3  . varenicline (CHANTIX CONTINUING MONTH PAK) 1 MG tablet Take 1 tablet (1 mg total) by mouth 2 (two) times daily. 60 tablet 2  . Vitamin D, Ergocalciferol, (DRISDOL) 1.25 MG (50000 UT) CAPS capsule Take 1 capsule (50,000 Units total) by mouth every 7 (seven) days. 12 capsule 0   No current facility-administered medications for this visit.     Past Medical History:  Diagnosis Date  . 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 Thyroid disease   . Wears dentures    full upper    Past Surgical History:  Procedure Laterality Date  . ABDOMINAL HYSTERECTOMY    . APPENDECTOMY    . CESAREAN SECTION    . CHOLECYSTECTOMY    . COLONOSCOPY WITH PROPOFOL N/A 07/02/2018   Procedure: COLONOSCOPY WITH BIOPSIES;  Surgeon: Lucilla Lame, MD;  Location: [facility];  Service: Endoscopy;  Laterality: N/A;  . NECK SURGERY    . POLYPECTOMY N/A 07/02/2018   Procedure: POLYPECTOMY;  Surgeon: Lucilla Lame, MD;  Location: [facility];  Service: Endoscopy;  Laterality: N/A;  . SPINE SURGERY    . THYROIDECTOMY    . TUBAL LIGATION    . VISCERAL  ANGIOGRAPHY N/A 05/13/2018   Procedure: VISCERAL ANGIOGRAPHY;  Surgeon: Algernon Huxley, MD;  Location: [facility];  Service: Cardiovascular;  Laterality: N/A;    Social History Social History   Tobacco Use  . Smoking status: Current Every Day Smoker    Packs/day: 1.00    Years: 40.00    Pack years: 40.00    Types: Cigarettes  . Smokeless tobacco: Never Used  . Tobacco comment: since age 89  Substance Use Topics  . Alcohol use: Yes    Comment: 1-2 glass wine/year  . Drug use: Never    Family History Family History  Problem Relation Age of Onset  . Hypertension Mother   . Stroke Mother 105  . Heart attack Paternal Grandfather   . Dementia Maternal Aunt   . Dementia Maternal Grandmother     No Known Allergies   REVIEW OF SYSTEMS (Negative unless checked)  Constitutional: [] Weight loss  [] Fever  [] Chills Cardiac: [] Chest pain   [] Chest pressure   [x] Palpitations   [] Shortness of breath when laying flat   [] Shortness of breath at rest   [x] Shortness of breath with exertion. Vascular:  [] Pain in legs with walking   [] Pain in legs at rest   [] Pain in legs when laying flat   [] Claudication   [] Pain in feet when walking  [] Pain in feet at rest  [] Pain in  feet when laying flat   [] History of DVT   [] Phlebitis   [] Swelling in legs   [] Varicose veins   [] Non-healing ulcers Pulmonary:   [] Uses home oxygen   [] Productive cough   [] Hemoptysis   [] Wheeze  [] COPD   [] Asthma Neurologic:  [] Dizziness  [] Blackouts   [] Seizures   [] History of stroke   [] History of TIA  [] Aphasia   [] Temporary blindness   [] Dysphagia   [] Weakness or numbness in arms   [] Weakness or numbness in legs Musculoskeletal:  [] Arthritis   [] Joint swelling   [] Joint pain   [] Low back pain Hematologic:  [] Easy bruising  [] Easy bleeding   [] Hypercoagulable state   [] Anemic   Gastrointestinal:  [] Blood in stool   [] Vomiting blood  [] Gastroesophageal reflux/heartburn   [] Abdominal pain Genitourinary:  [] Chronic kidney  disease   [] Difficult urination  [] Frequent urination  [] Burning with urination   [] Hematuria Skin:  [] Rashes   [] Ulcers   [] Wounds Psychological:  [] History of anxiety   []  History of major depression.  Physical Examination  BP 122/80 (BP Location: Right Arm)   Pulse 80   Resp 18   Ht 5\' 5"  (1.651 m)   Wt 205 lb (93 kg)   BMI 34.11 kg/m  Gen:  WD/WN, NAD Head: [place]/AT, No temporalis wasting. Ear/Nose/Throat: Hearing grossly intact, nares w/o erythema or drainage Eyes: Conjunctiva clear. Sclera non-icteric Neck: Supple.  Trachea midline Pulmonary:  Good air movement, no use of accessory muscles.  Cardiac: RRR, no JVD Vascular:  Vessel Right Left  Radial Palpable Palpable                                   Gastrointestinal: soft, non-tender/non-distended. No guarding/reflex.  Musculoskeletal: M/S 5/5 throughout.  No deformity or atrophy. No edema. Neurologic: Sensation grossly intact in extremities.  Symmetrical.  Speech is fluent.  Psychiatric: Judgment intact, Mood & affect appropriate for pt's clinical situation. Dermatologic: No rashes or ulcers noted.  No cellulitis or open wounds.       Labs Recent Results (from the past 2160 hour(s))  Lyme Ab/Western Blot Reflex     Status: Abnormal   Collection Time: 06/14/18  1:36 PM  Result Value Ref Range   Lyme IgG/IgM Ab <0.91 0.00 - 0.90 ISR    Comment:                                 Negative         <0.91                                 Equivocal  0.91 - 1.09                                 Positive         >1.09    LYME DISEASE AB, QUANT, IGM 2.79 (H) 0.00 - 0.79 index    Comment:                                 Negative         <0.80  Equivocal  0.80 - 1.19                                 Positive         >1.19  IgM levels may peak at 3-6 weeks post infection, then  gradually decline.   Rocky mtn spotted fvr abs pnl(IgG+IgM)     Status: Abnormal   Collection Time: 06/14/18  1:36  PM  Result Value Ref Range   RMSF IgG Negative Negative   RMSF IgM 1.19 (H) 0.00 - 0.89 index    Comment:                                  Negative        <0.90                                  Equivocal 0.90 - 1.10                                  Positive        >1.10   Babesia microti Antibody Panel     Status: None   Collection Time: 06/14/18  1:36 PM  Result Value Ref Range   Babesia microti IgM <1:10 Neg:<1:10   Babesia microti IgG <1:10 Neg:<1:10    Comment: This test was developed and its performance characteristics determined by [employer]. It has not been cleared or approved by the U.S. Food and Drug Administration. The FDA has determined that such clearance or approval is not necessary. This test is used for clinical purposes. It should not be regarded as investigational or research.   Ehrlichia Antibody Panel     Status: None   Collection Time: 06/14/18  1:36 PM  Result Value Ref Range   E.Chaffeensis (HME) IgG Negative Neg:<1:64   E. Chaffeensis (HME) IgM Titer Negative Neg:<1:20    Comment: IgG titers if 1:64 or greater indicate exposure or  acute and convalescent samples showing a four-fold increase, and/or the presence of IgM indicate recent or current infection.    HGE IgG Titer Negative Neg:<1:64    Comment: HGE IgG levels are detectable 7 to 10 days post infection and persist approximately one year.    HGE IgM Titer Negative Neg:<1:20    Comment: Due to a reagent backorder, this test was performed using a different assay. The reference interval for this alternate assay is:                                        Negative      <1:64                                        Positive       1:64 or greater IgM levels usually rise 3 to 5 days post infection and fall to normal levels in approximately 30 to 60 days.   Lyme, Western Blot, Serum (reflexed)     Status: None   Collection Time: 06/14/18  1:36 PM  Result Value  Ref Range     IgG P93 Ab. Absent       IgG P66 Ab. Absent      IgG P58 Ab. Absent      IgG P45 Ab. Absent      IgG P41 Ab. Absent      IgG P39 Ab. Absent      IgG P30 Ab. Absent      IgG P28 Ab. Absent      IgG P23 Ab. Absent      IgG P18 Ab. Absent    Lyme IgG Wb Negative     Comment:                      Positive: 5 of the following                                Borrelia-specific bands:                                18,23,28,30,39,41,45,58,                                66, and 93.                      Negative: No bands or banding                                patterns which do not                                meet positive criteria.      IgM P41 Ab. Absent      IgM P39 Ab. Absent      IgM P23 Ab. Absent    Lyme IgM Wb Negative     Comment: Note: An equivocal or positive EIA result followed by a negative Line Blot result is considered NEGATIVE. An equivocal or positive EIA result followed by a positive Line Blot is considered POSITIVE by the CDC. Positive: 2 of the following bands: 23,39 or 41 Negative: No bands or banding patterns which do not meet positive criteria. Criteria for positivity are those recommended by CDC/ASTPHLD. p23=Osp C, p41=flagellin Note: Sera from individuals with the following may cross react in the Lyme Line Blot assays: other spirochetal diseases (periodontal disease, leptospirosis, relapsing fever, yaws, and pinta); connective autoimmune (Rheumatoid Arthritis and Systemic Lupus Erythematosus and also individuals with Antinuclear Antibody); other infections [facility] Spotted Fever; Epstein-Barr Virus, and Cytomegalovirus).     Radiology No results found.  Assessment/Plan  Splenic artery aneurysm (HCC) Her duplex today does not show any flow in any splenic artery aneurysm.  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  No other mesenteric artery disease  is identified either. 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  We will now follow her on an annual basis.    Leotis Pain, MD  09/10/2018 10:08 AM    This note was created with Dragon medical transcription system.  Any errors from dictation  are purely unintentional

## 2018-09-14 DIAGNOSIS — R109 Unspecified abdominal pain: Secondary | ICD-10-CM | POA: Diagnosis not present

## 2018-11-03 ENCOUNTER — Other Ambulatory Visit: Payer: Self-pay

## 2018-11-03 MED ORDER — VARENICLINE TARTRATE 1 MG PO TABS: 1.0000 mg | ORAL_TABLET | Freq: Two times a day (BID) | ORAL | 2 refills | Status: AC

## 2019-06-19 IMAGING — CR DG CERVICAL SPINE COMPLETE 4+V
7 series · 7 of 7 positions shown · non-contrast
Comparison: None.

CLINICAL DATA: Right neck pain with right arm and hand pain.

EXAM:
CERVICAL SPINE - COMPLETE 4+ VIEW

[c-spine lat]
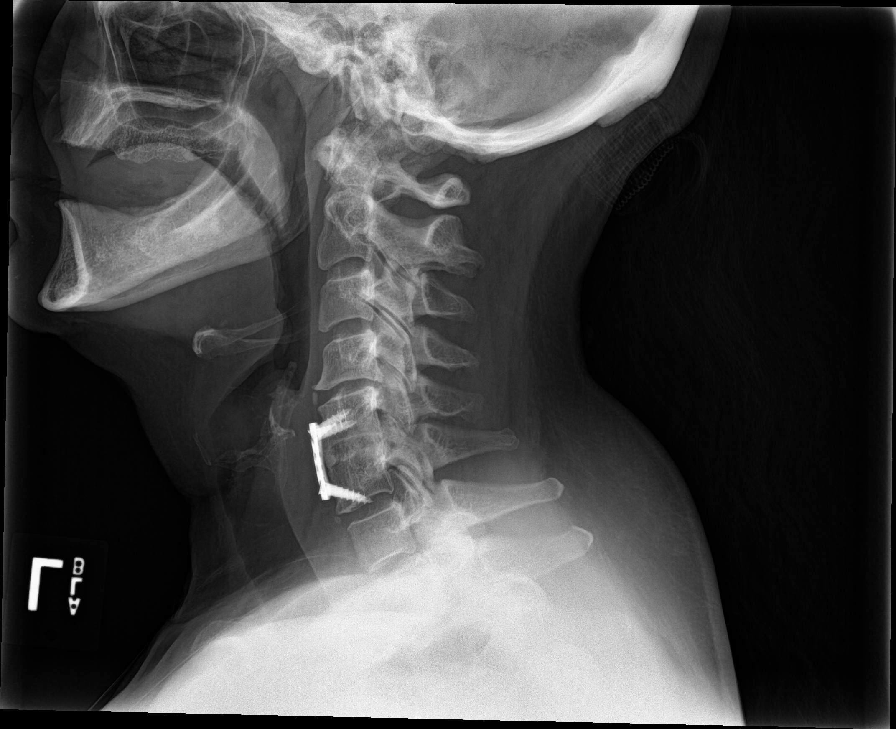

[c-spine obl (1 of 2)]
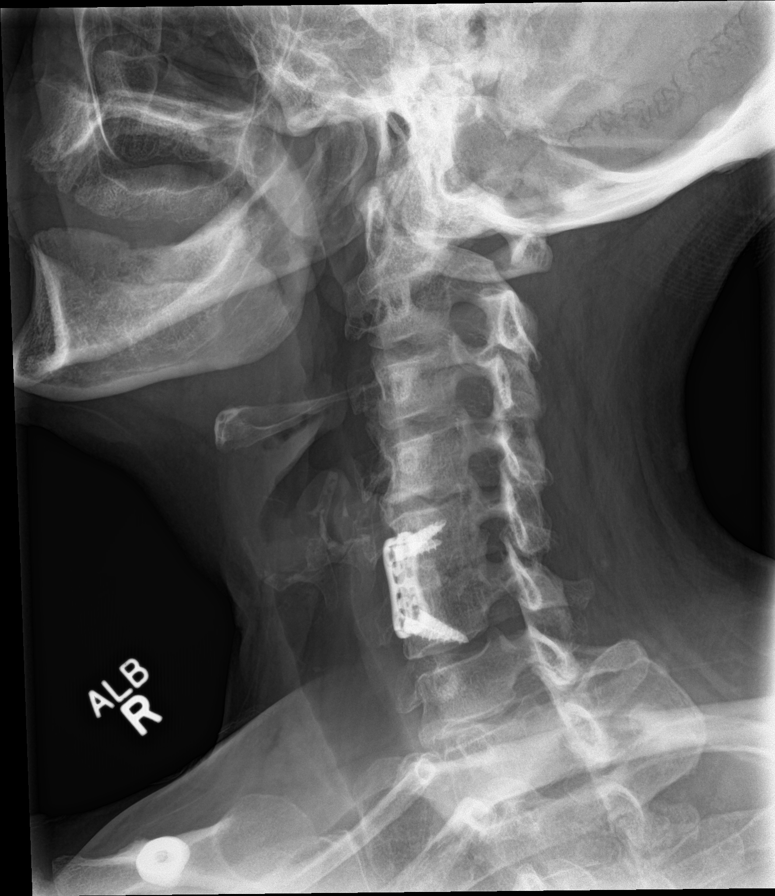

[c-spine obl (2 of 2)]
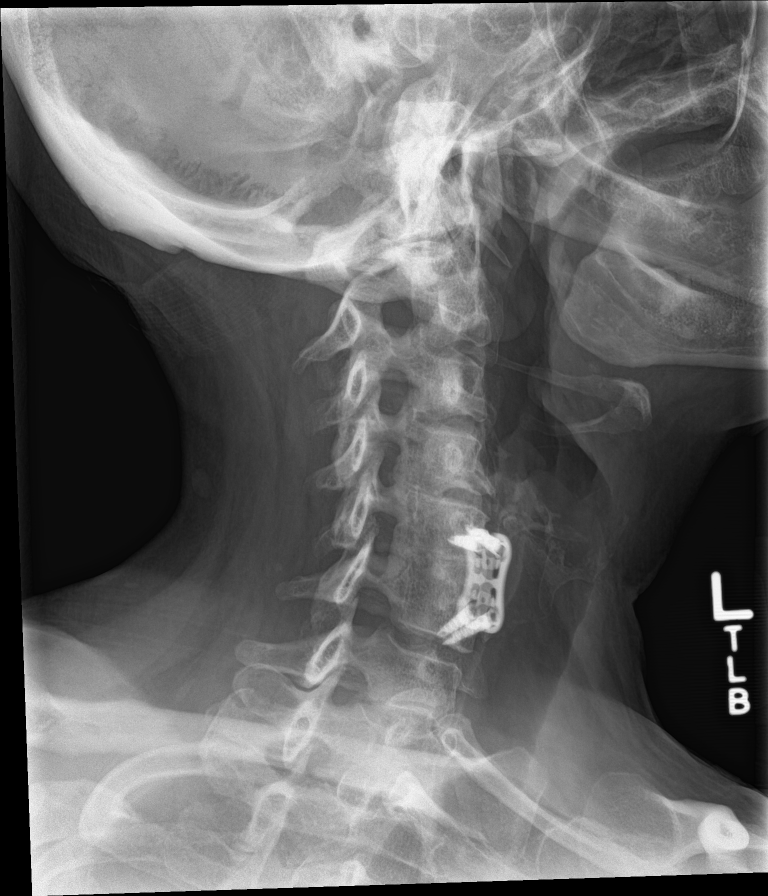

[c-spine ap]
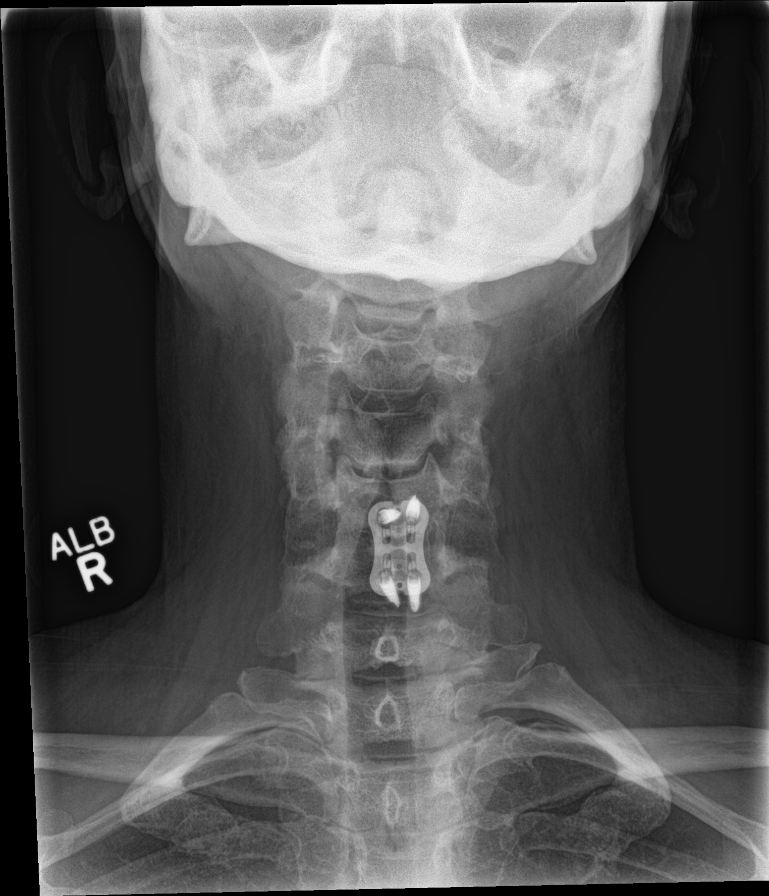

[c-spine open mouth]
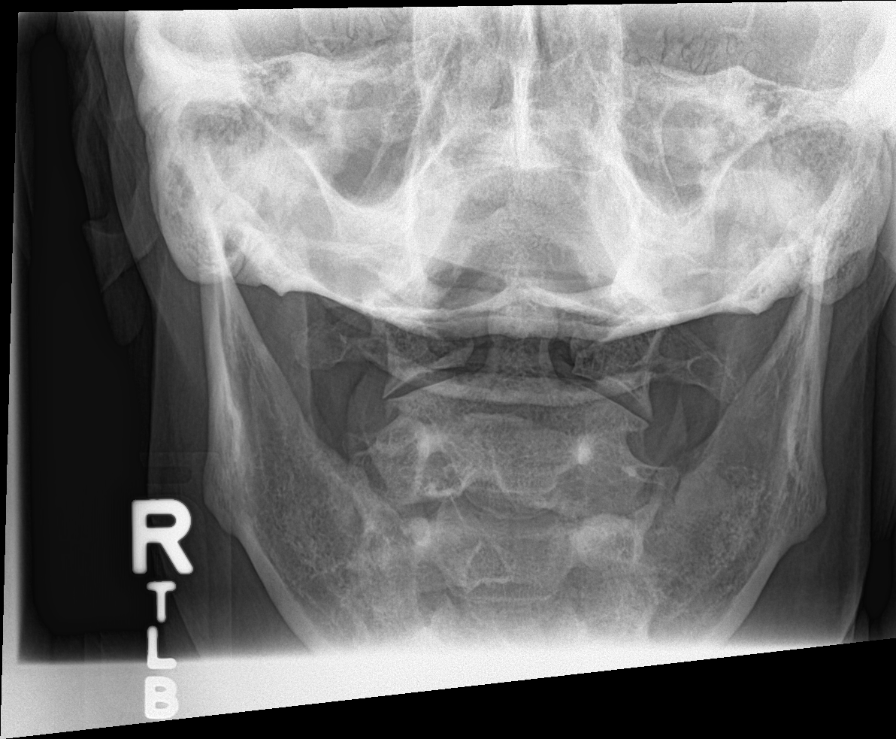

[[person_name]]
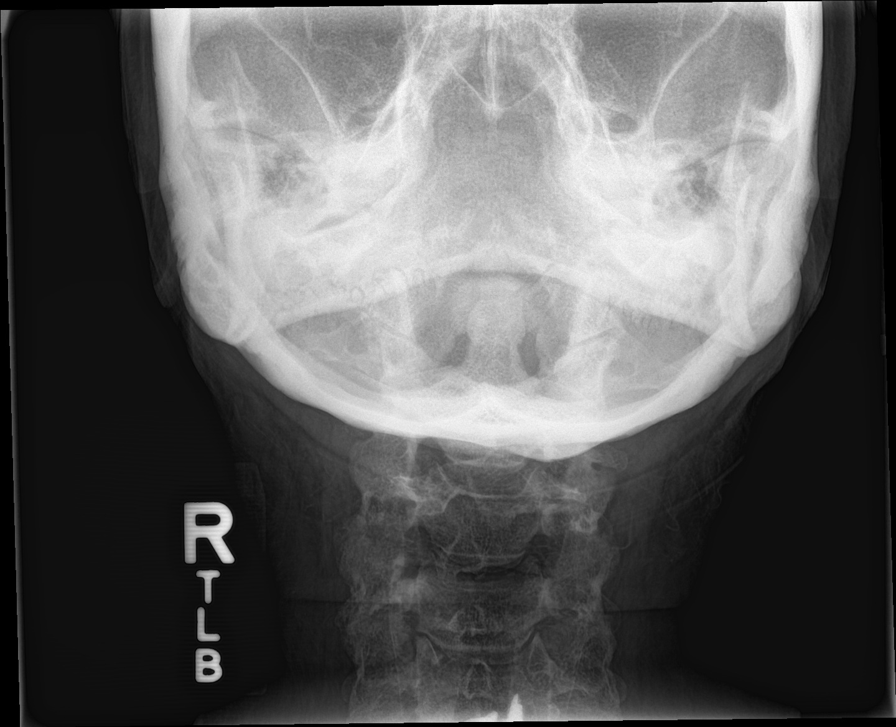

[ct-spine swimmers]
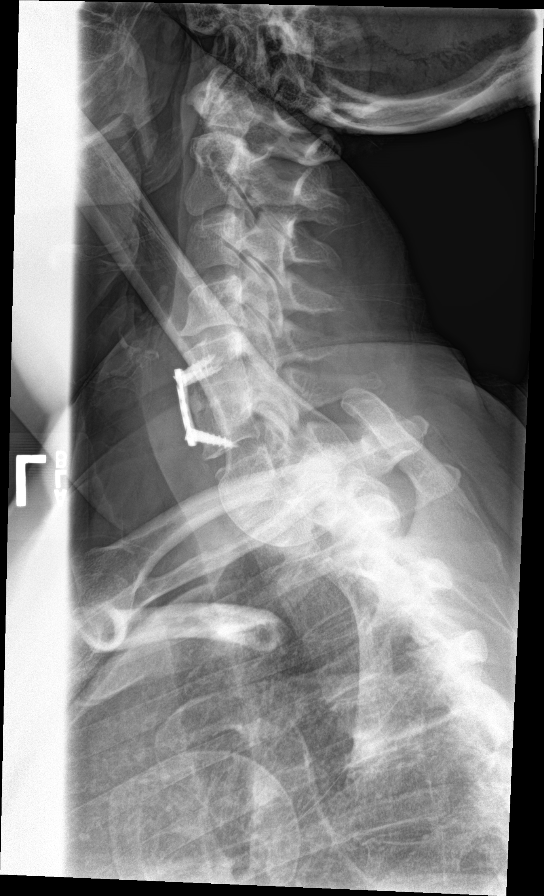

[7 of 7 positions shown; findings below may reference images not displayed]

FINDINGS: Vertebral body alignment and heights are normal. There is mild
spondylosis of the cervical spine. Anterior fusion hardware is
intact at the C5-6 level. Prevertebral soft tissues are normal.
Subtle disc space narrowing at the C4-5 level. Very minimal
bilateral neural foraminal narrowing at the C5-6 level. Mild
right-sided neural from narrowing at the C4-5 level. Mild
uncovertebral joint spurring. Atlantoaxial articulation is within
normal.
IMPRESSION: Mild spondylosis of the cervical spine with minimal disc disease at
the C4-5 level. Anterior fusion hardware intact at the C5-6 level.

Subtle neural foraminal narrowing as described.

## 2019-09-13 ENCOUNTER — Ambulatory Visit (INDEPENDENT_AMBULATORY_CARE_PROVIDER_SITE_OTHER): Payer: BLUE CROSS/BLUE SHIELD | Admitting: Vascular Surgery

## 2019-09-13 ENCOUNTER — Encounter (INDEPENDENT_AMBULATORY_CARE_PROVIDER_SITE_OTHER): Payer: Medicaid Other

## 2020-05-09 DIAGNOSIS — N3941 Urge incontinence: Secondary | ICD-10-CM | POA: Insufficient documentation

## 2020-08-27 DIAGNOSIS — N393 Stress incontinence (female) (male): Secondary | ICD-10-CM | POA: Insufficient documentation

## 2020-08-27 DIAGNOSIS — N8111 Cystocele, midline: Secondary | ICD-10-CM | POA: Insufficient documentation

## 2020-08-27 DIAGNOSIS — N816 Rectocele: Secondary | ICD-10-CM | POA: Insufficient documentation

## 2021-01-22 DIAGNOSIS — H02831 Dermatochalasis of right upper eyelid: Secondary | ICD-10-CM | POA: Insufficient documentation

## 2021-01-22 DIAGNOSIS — H02403 Unspecified ptosis of bilateral eyelids: Secondary | ICD-10-CM | POA: Insufficient documentation

## 2021-07-15 DIAGNOSIS — I959 Hypotension, unspecified: Secondary | ICD-10-CM | POA: Insufficient documentation

## 2022-01-20 DIAGNOSIS — G8929 Other chronic pain: Secondary | ICD-10-CM | POA: Insufficient documentation

## 2022-02-17 DIAGNOSIS — M75101 Unspecified rotator cuff tear or rupture of right shoulder, not specified as traumatic: Secondary | ICD-10-CM | POA: Insufficient documentation

## 2022-03-03 ENCOUNTER — Encounter (INDEPENDENT_AMBULATORY_CARE_PROVIDER_SITE_OTHER): Payer: Self-pay

## 2022-04-07 DIAGNOSIS — M7521 Bicipital tendinitis, right shoulder: Secondary | ICD-10-CM | POA: Insufficient documentation

## 2022-04-07 DIAGNOSIS — M19011 Primary osteoarthritis, right shoulder: Secondary | ICD-10-CM | POA: Insufficient documentation

## 2023-06-02 ENCOUNTER — Ambulatory Visit: Payer: Commercial Managed Care - PPO

## 2023-06-02 ENCOUNTER — Ambulatory Visit: Payer: Commercial Managed Care - PPO | Admitting: Podiatry

## 2023-06-02 ENCOUNTER — Encounter: Payer: Self-pay | Admitting: Podiatry

## 2023-06-02 VITALS — Ht 65.0 in | Wt 205.0 lb

## 2023-06-02 DIAGNOSIS — L600 Ingrowing nail: Secondary | ICD-10-CM | POA: Diagnosis not present

## 2023-06-02 DIAGNOSIS — T8484XA Pain due to internal orthopedic prosthetic devices, implants and grafts, initial encounter: Secondary | ICD-10-CM | POA: Diagnosis not present

## 2023-06-02 DIAGNOSIS — Z01818 Encounter for other preprocedural examination: Secondary | ICD-10-CM | POA: Diagnosis not present

## 2023-06-02 NOTE — Progress Notes (Signed)
Subjective:  Patient ID: Crystal Carson, female    DOB: 1965-06-28,  MRN: [id number]  Chief Complaint  Patient presents with   Foot Pain    Pt is here due to several reasons, she states she was seen here in 2020 for ingrown to bilateral great toes and to start the process th get the hardware removed from right foot, she states then COVID hit and she put everything off, and now is back to start everything back up. Wants to get hardware removed from right foot and to get bilateral ingrown's removed.    58 y.o. female presents with the above complaint.  Patient presents with continuous complaint of right painful hardware that was placed in 2011.  She states that she has tried shoe gear modification padding protecting offloading but the screw head is really bothering her she would like to have it removed she has not seen anyone else prior to seeing me for this.  Pain scale is 7 out of 10 hurts with ambulation worse with pressure she has failed all conservative care including padding and shoe gear modification and offloading.  She would like to discuss surgical option.  She also has secondary complaint bilateral hallux medial lateral border ingrown she would like to have removed.  She states been bothering her she had it previously removed as well as seems to be going back   Review of Systems: Negative except as noted in the HPI. Denies N/V/F/Ch.  Past Medical History:  Diagnosis Date   Splenic artery aneurysm (HCC)    coil embolization 05/13/18   Thyroid disease    Wears dentures    full upper    Current Outpatient Medications:    triamcinolone cream (KENALOG) 0.1 %, Apply 1 application topically 2 (two) times daily., Disp: 30 g, Rfl: 3   varenicline (CHANTIX CONTINUING MONTH PAK) 1 MG tablet, Take 1 tablet (1 mg total) by mouth 2 (two) times daily., Disp: 60 tablet, Rfl: 2   Vitamin D, Ergocalciferol, (DRISDOL) 1.25 MG (50000 UT) CAPS capsule, Take 1 capsule (50,000 Units total) by mouth  every 7 (seven) days., Disp: 12 capsule, Rfl: 0  Social History   Tobacco Use  Smoking Status Every Day   Current packs/day: 1.00   Average packs/day: 1 pack/day for 40.0 years (40.0 ttl pk-yrs)   Types: Cigarettes  Smokeless Tobacco Never  Tobacco Comments   since age 23    No Known Allergies Objective:  There were no vitals filed for this visit. Body mass index is 34.11 kg/m. Constitutional Well developed. Well nourished.  Vascular Dorsalis pedis pulses palpable bilaterally. Posterior tibial pulses palpable bilaterally. Capillary refill normal to all digits.  No cyanosis or clubbing noted. Pedal hair growth normal.  Neurologic Normal speech. Oriented to person, place, and time. Epicritic sensation to light touch grossly present bilaterally.  Dermatologic Nails well groomed and normal in appearance. No open wounds. No skin lesions.  Orthopedic: Pain on palpation of the right Carson tarsometatarsal joint prominent screw head clinically appreciated no open wounds or lesion noted.  Painful orthopedic hardware noted.  Pain on palpation of bilateral hallux medial and lateral border regrowing or recurrence of ingrown noted.   Radiographs: 3 views of skeletally mature adult right foot: Arthrodesis of Carson tarsal metatarsophalangeal joint pain noted.  Prominent dorsal screw head clinically appreciated.  Rest of the hardware is intact Assessment:   1. Painful orthopaedic hardware (HCC)   2. Ingrown toenail of right foot   3. Ingrown left big toenail  4. Encounter for preoperative examination for general surgical procedure    Plan:  Patient was evaluated and treated and all questions answered.  Right painful orthopedic hardware and bilateral hallux medial lateral border ingrown -All questions and concerns were discussed with the patient in extensive detail given the amount of pain that she is having she will benefit from removal of all hardware.  She agrees with the plan like to  proceed with surgery.  She also has secondary complaint of removing the painful ingrown.  I discussed my preoperative intraoperative postoperative plan with the patient in extensive detail she would like to do both heart removal as well as bilateral medial and lateral border phenol matricectomy of the ingrown. -Informed surgical risk consent was reviewed and read aloud to the patient.  I reviewed the films.  I have discussed my findings with the patient in great detail.  I have discussed all risks including but not limited to infection, stiffness, scarring, limp, disability, deformity, damage to blood vessels and nerves, numbness, poor healing, need for braces, arthritis, chronic pain, amputation, death.  All benefits and realistic expectations discussed in great detail.  I have made no promises as to the outcome.  I have provided realistic expectations.  I have offered the patient a 2nd opinion, which they have declined and assured me they preferred to proceed despite the risks   No follow-ups on file.

## 2023-06-11 ENCOUNTER — Telehealth: Payer: Self-pay | Admitting: Podiatry

## 2023-06-11 NOTE — Telephone Encounter (Signed)
 DOS- 06/29/23  REMOVAL HARDWARE [id number] EXC NAIL PERM 1ST [id number]  [facility] SHARED SERVICES EFFECTIVE DATE- 05/05/21  SPOKE WITH AMBER C FROM [facility] SHARED SERVICES AND SHE STATED THAT PRIOR AUTH IS NOT REQUIRED FOR CPT CODES 79319 AND 11750.  CALL REF #: AMBER C. 06/05/23 @ 9:12 AM EST   [employer] MEDICAID EFFECTIVE DATE- 11/03/19  PER THE [employer] PORTAL, PRIOR AUTH HAS BEEN APPROVED FOR CPT CODES 79319 AND 11750. GOOD FROM 06/29/23 - 09/02/23.  AUTH REF #: [id number]

## 2023-06-29 DIAGNOSIS — Z4889 Encounter for other specified surgical aftercare: Secondary | ICD-10-CM

## 2023-06-29 DIAGNOSIS — L600 Ingrowing nail: Secondary | ICD-10-CM

## 2023-07-02 ENCOUNTER — Other Ambulatory Visit: Payer: Self-pay | Admitting: Podiatry

## 2023-07-02 MED ORDER — IBUPROFEN 800 MG PO TABS: 800.0000 mg | ORAL_TABLET | Freq: Four times a day (QID) | ORAL | 1 refills | Status: AC | PRN

## 2023-07-02 MED ORDER — OXYCODONE-ACETAMINOPHEN 5-325 MG PO TABS: 1.0000 | ORAL_TABLET | ORAL | 0 refills | Status: AC | PRN

## 2023-07-07 ENCOUNTER — Ambulatory Visit (INDEPENDENT_AMBULATORY_CARE_PROVIDER_SITE_OTHER)

## 2023-07-07 ENCOUNTER — Encounter: Payer: Commercial Managed Care - PPO | Admitting: Podiatry

## 2023-07-07 ENCOUNTER — Ambulatory Visit (INDEPENDENT_AMBULATORY_CARE_PROVIDER_SITE_OTHER): Admitting: Podiatry

## 2023-07-07 DIAGNOSIS — T8484XA Pain due to internal orthopedic prosthetic devices, implants and grafts, initial encounter: Secondary | ICD-10-CM | POA: Diagnosis not present

## 2023-07-07 DIAGNOSIS — Z9889 Other specified postprocedural states: Secondary | ICD-10-CM

## 2023-07-07 NOTE — Progress Notes (Signed)
  Subjective:  Patient ID: Crystal Carson First, female    DOB: 07/30/1965,  MRN: [id number]  Chief Complaint  Patient presents with   Routine Post Op    DOS 07/02/23, RIGHT FOOT REMOVAL OF HARDWARE AND BILATERAL HALLUX INGROWN BOTH MEDIAL/ LATERAL BORDER PHENOL MATRICECTOMY    DOS: 07/02/2023 Procedure: Removal of all hardware and ingrown both medial and lateral border  58 y.o. female returns for post-op check.  Patient states she is doing well.  Minimal pain.  Is ambulating with a cam boot.  Denies any other acute issues  Review of Systems: Negative except as noted in the HPI. Denies N/V/F/Ch.  Past Medical History:  Diagnosis Date   Splenic artery aneurysm (HCC)    coil embolization 05/13/18   Thyroid disease    Wears dentures    full upper    Current Outpatient Medications:    ibuprofen (ADVIL) 800 MG tablet, Take 1 tablet (800 mg total) by mouth every 6 (six) hours as needed., Disp: 60 tablet, Rfl: 1   oxyCODONE-acetaminophen (PERCOCET) 5-325 MG tablet, Take 1 tablet by mouth every 4 (four) hours as needed for severe pain (pain score 7-10)., Disp: 30 tablet, Rfl: 0   triamcinolone cream (KENALOG) 0.1 %, Apply 1 application topically 2 (two) times daily., Disp: 30 g, Rfl: 3   varenicline (CHANTIX CONTINUING MONTH PAK) 1 MG tablet, Take 1 tablet (1 mg total) by mouth 2 (two) times daily., Disp: 60 tablet, Rfl: 2   Vitamin D, Ergocalciferol, (DRISDOL) 1.25 MG (50000 UT) CAPS capsule, Take 1 capsule (50,000 Units total) by mouth every 7 (seven) days., Disp: 12 capsule, Rfl: 0  Social History   Tobacco Use  Smoking Status Every Day   Current packs/day: 1.00   Average packs/day: 1 pack/day for 40.0 years (40.0 ttl pk-yrs)   Types: Cigarettes  Smokeless Tobacco Never  Tobacco Comments   since age 19    No Known Allergies Objective:  There were no vitals filed for this visit. There is no height or weight on file to calculate BMI. Constitutional Well developed. Well nourished.   Vascular Foot warm and well perfused. Capillary refill normal to all digits.   Neurologic Normal speech. Oriented to person, place, and time. Epicritic sensation to light touch grossly present bilaterally.  Dermatologic Skin healing well without signs of infection. Skin edges well coapted without signs of infection.  Orthopedic: Tenderness to palpation noted about the surgical site.   Radiographs: 3 views of skeletally mature adult right foot: No further hardware noted.  No other abnormalities noted.  Staples noted Assessment:   1. Painful orthopaedic hardware (HCC)   2. Status post foot surgery    Plan:  Patient was evaluated and treated and all questions answered.  S/p foot surgery right -Progressing as expected post-operatively. -XR: See above -WB Status: Weightbearing as tolerated in cam boot -Sutures: Intact.  No clinical signs of dehiscence noted no complication noted -Medications: None -Foot redressed.  No follow-ups on file.

## 2023-07-09 ENCOUNTER — Encounter: Payer: Commercial Managed Care - PPO | Admitting: Podiatry

## 2023-07-21 ENCOUNTER — Encounter: Payer: Commercial Managed Care - PPO | Admitting: Podiatry

## 2023-07-23 ENCOUNTER — Ambulatory Visit (INDEPENDENT_AMBULATORY_CARE_PROVIDER_SITE_OTHER): Payer: Commercial Managed Care - PPO | Admitting: Podiatry

## 2023-07-23 DIAGNOSIS — T8484XA Pain due to internal orthopedic prosthetic devices, implants and grafts, initial encounter: Secondary | ICD-10-CM

## 2023-07-23 DIAGNOSIS — Z9889 Other specified postprocedural states: Secondary | ICD-10-CM

## 2023-07-23 NOTE — Progress Notes (Signed)
  Subjective:  Patient ID: Crystal Carson First, female    DOB: 05/05/1966,  MRN: [id number]  Chief Complaint  Patient presents with   Routine Post Op    DOS 07/02/23, RIGHT FOOT REMOVAL OF HARDWARE AND BILATERAL HALLUX INGROWN BOTH MEDIAL/ LATERAL BORDER PHENOL MATRICECTOMY    DOS: 07/02/2023 Procedure: Removal of all hardware and ingrown both medial and lateral border  58 y.o. female returns for post-op check.  Patient states she is doing well.  Minimal pain.  Is ambulating with a cam boot.  Denies any other acute issues  Review of Systems: Negative except as noted in the HPI. Denies N/V/F/Ch.  Past Medical History:  Diagnosis Date   Splenic artery aneurysm (HCC)    coil embolization 05/13/18   Thyroid disease    Wears dentures    full upper    Current Outpatient Medications:    buPROPion (WELLBUTRIN SR) 150 MG 12 hr tablet, Take by mouth., Disp: , Rfl:    tiZANidine (ZANAFLEX) 4 MG tablet, Take by mouth., Disp: , Rfl:    Black Cohosh 540 MG CAPS, Take by mouth., Disp: , Rfl:    fluticasone (FLONASE) 50 MCG/ACT nasal spray, 1 spray into each nostril daily., Disp: , Rfl:    ibuprofen (ADVIL) 800 MG tablet, Take 1 tablet (800 mg total) by mouth every 6 (six) hours as needed., Disp: 60 tablet, Rfl: 1   oxyCODONE-acetaminophen (PERCOCET) 5-325 MG tablet, Take 1 tablet by mouth every 4 (four) hours as needed for severe pain (pain score 7-10)., Disp: 30 tablet, Rfl: 0   triamcinolone cream (KENALOG) 0.1 %, Apply 1 application topically 2 (two) times daily., Disp: 30 g, Rfl: 3   varenicline (CHANTIX CONTINUING MONTH PAK) 1 MG tablet, Take 1 tablet (1 mg total) by mouth 2 (two) times daily., Disp: 60 tablet, Rfl: 2   Vitamin D, Ergocalciferol, (DRISDOL) 1.25 MG (50000 UT) CAPS capsule, Take 1 capsule (50,000 Units total) by mouth every 7 (seven) days., Disp: 12 capsule, Rfl: 0  Social History   Tobacco Use  Smoking Status Every Day   Current packs/day: 1.00   Average packs/day: 1 pack/day  for 40.0 years (40.0 ttl pk-yrs)   Types: Cigarettes  Smokeless Tobacco Never  Tobacco Comments   since age 22    No Known Allergies Objective:  There were no vitals filed for this visit. There is no height or weight on file to calculate BMI. Constitutional Well developed. Well nourished.  Vascular Foot warm and well perfused. Capillary refill normal to all digits.   Neurologic Normal speech. Oriented to person, place, and time. Epicritic sensation to light touch grossly present bilaterally.  Dermatologic Skin complaint epithelialized.  No tenderness noted.  Incision sites have healed  Orthopedic: No further tenderness to palpation noted about the surgical site.   Radiographs: 3 views of skeletally mature adult right foot: No further hardware noted.  No other abnormalities noted.  Staples noted Assessment:   No diagnosis found.  Plan:  Patient was evaluated and treated and all questions answered.  S/p foot surgery right -Clinically healed and officially discharged from my care if any foot and ankle issues on future she will come back and see me.  No follow-ups on file.

## 2023-09-03 ENCOUNTER — Telehealth: Payer: Self-pay

## 2023-09-03 NOTE — Telephone Encounter (Signed)
Pt is overdue for repeat colonoscopy

## 2023-09-09 NOTE — Telephone Encounter (Signed)
 Left message on voicemail.

## 2023-09-25 NOTE — Telephone Encounter (Signed)
Pt lmovm returning my call

## 2023-10-06 NOTE — Telephone Encounter (Signed)
 I have been unable to reach this patient by phone.  A letter is being sent.

## 2024-02-10 ENCOUNTER — Other Ambulatory Visit: Payer: Self-pay | Admitting: Podiatry
